# Patient Record
Sex: Male | Born: 1955
Health system: Southern US, Community
[De-identification: ages and names within clinical notes are randomized; demographics above are authoritative.]

## PROBLEM LIST (undated history)

## (undated) DIAGNOSIS — M1990 Unspecified osteoarthritis, unspecified site: Secondary | ICD-10-CM

## (undated) DIAGNOSIS — K611 Rectal abscess: Secondary | ICD-10-CM

## (undated) DIAGNOSIS — Z87442 Personal history of urinary calculi: Secondary | ICD-10-CM

## (undated) DIAGNOSIS — E785 Hyperlipidemia, unspecified: Secondary | ICD-10-CM

## (undated) DIAGNOSIS — K219 Gastro-esophageal reflux disease without esophagitis: Secondary | ICD-10-CM

## (undated) HISTORY — PX: ROTATOR CUFF REPAIR: SHX139

## (undated) HISTORY — PX: UPPER GASTROINTESTINAL ENDOSCOPY: SHX188

## (undated) HISTORY — DX: Hyperlipidemia, unspecified: E78.5

## (undated) HISTORY — DX: Rectal abscess: K61.1

## (undated) HISTORY — PX: INGUINAL HERNIA REPAIR: SHX194

## (undated) HISTORY — PX: POLYPECTOMY: SHX149

## (undated) HISTORY — PX: COLONOSCOPY: SHX174

## (undated) HISTORY — PX: KNEE SURGERY: SHX244

---

## 1999-03-06 ENCOUNTER — Encounter: Payer: Self-pay | Admitting: Neurosurgery

## 1999-03-06 ENCOUNTER — Encounter: Admission: RE | Admit: 1999-03-06 | Discharge: 1999-03-06 | Payer: Self-pay | Admitting: Neurosurgery

## 1999-03-11 ENCOUNTER — Encounter: Payer: Self-pay | Admitting: Neurosurgery

## 1999-03-11 ENCOUNTER — Encounter: Admission: RE | Admit: 1999-03-11 | Discharge: 1999-03-11 | Payer: Self-pay | Admitting: Neurosurgery

## 1999-05-30 ENCOUNTER — Ambulatory Visit (HOSPITAL_BASED_OUTPATIENT_CLINIC_OR_DEPARTMENT_OTHER): Admission: RE | Admit: 1999-05-30 | Discharge: 1999-05-30 | Payer: Self-pay | Admitting: Orthopedic Surgery

## 2000-10-23 ENCOUNTER — Ambulatory Visit (HOSPITAL_COMMUNITY): Admission: RE | Admit: 2000-10-23 | Discharge: 2000-10-23 | Payer: Self-pay | Admitting: Neurosurgery

## 2000-10-23 ENCOUNTER — Encounter: Payer: Self-pay | Admitting: Neurosurgery

## 2003-05-22 ENCOUNTER — Emergency Department (HOSPITAL_COMMUNITY): Admission: EM | Admit: 2003-05-22 | Discharge: 2003-05-22 | Payer: Self-pay | Admitting: Emergency Medicine

## 2007-09-20 ENCOUNTER — Ambulatory Visit: Payer: Self-pay | Admitting: Gastroenterology

## 2007-10-01 ENCOUNTER — Encounter: Payer: Self-pay | Admitting: Gastroenterology

## 2007-10-01 ENCOUNTER — Ambulatory Visit: Payer: Self-pay | Admitting: Gastroenterology

## 2007-10-06 ENCOUNTER — Encounter: Payer: Self-pay | Admitting: Gastroenterology

## 2008-05-03 ENCOUNTER — Encounter (INDEPENDENT_AMBULATORY_CARE_PROVIDER_SITE_OTHER): Payer: Self-pay | Admitting: *Deleted

## 2008-05-05 ENCOUNTER — Ambulatory Visit (HOSPITAL_COMMUNITY): Admission: RE | Admit: 2008-05-05 | Discharge: 2008-05-05 | Payer: Self-pay | Admitting: Surgery

## 2008-08-02 ENCOUNTER — Ambulatory Visit: Payer: Self-pay | Admitting: Gastroenterology

## 2008-08-02 ENCOUNTER — Encounter (INDEPENDENT_AMBULATORY_CARE_PROVIDER_SITE_OTHER): Payer: Self-pay | Admitting: *Deleted

## 2008-08-02 DIAGNOSIS — K219 Gastro-esophageal reflux disease without esophagitis: Secondary | ICD-10-CM | POA: Insufficient documentation

## 2008-08-02 DIAGNOSIS — Z860100 Personal history of colon polyps, unspecified: Secondary | ICD-10-CM | POA: Insufficient documentation

## 2008-08-03 ENCOUNTER — Ambulatory Visit: Payer: Self-pay | Admitting: Gastroenterology

## 2008-08-08 ENCOUNTER — Telehealth: Payer: Self-pay | Admitting: Gastroenterology

## 2008-09-06 ENCOUNTER — Telehealth: Payer: Self-pay | Admitting: Gastroenterology

## 2008-09-25 ENCOUNTER — Emergency Department (HOSPITAL_COMMUNITY): Admission: EM | Admit: 2008-09-25 | Discharge: 2008-09-25 | Payer: Self-pay | Admitting: Emergency Medicine

## 2010-08-03 ENCOUNTER — Emergency Department (HOSPITAL_COMMUNITY): Payer: 59

## 2010-08-03 ENCOUNTER — Emergency Department (HOSPITAL_COMMUNITY)
Admission: EM | Admit: 2010-08-03 | Discharge: 2010-08-03 | Disposition: A | Discharge status: Home / Self Care | Payer: 59 | Attending: Emergency Medicine | Admitting: Emergency Medicine

## 2010-08-03 DIAGNOSIS — W278XXA Contact with other nonpowered hand tool, initial encounter: Secondary | ICD-10-CM | POA: Insufficient documentation

## 2010-08-03 DIAGNOSIS — S61209A Unspecified open wound of unspecified finger without damage to nail, initial encounter: Secondary | ICD-10-CM | POA: Insufficient documentation

## 2010-08-06 NOTE — Op Note (Signed)
NAME:  [name], Albert             ACCOUNT NO.:  [id number]   MEDICAL RECORD NO.:  [id number]          PATIENT TYPE:  AMB   LOCATION:  DAY                          FACILITY:  [facility]   PHYSICIAN:  Ardeth Sportsman, MD     DATE OF BIRTH:  February 24, 1956   DATE OF PROCEDURE:  05/05/2008  DATE OF DISCHARGE:                               OPERATIVE REPORT   PRIMARY CARE PHYSICIAN:  Dr. Murray Hodgkins at [facility].   UROLOGIST:  Dr. Lynelle Smoke I. Tannenbaum at [facility].   SURGEON:  Ardeth Sportsman, MD   ASSISTANT:  None.   PREOPERATIVE DIAGNOSES:  1. Left inguinal hernia.  2. Possible right inguinal hernia.   POSTOPERATIVE DIAGNOSES:  1. Left indirect/direct pantaloon-type hernia.  2. Right direct inguinal hernia.   PROCEDURE:  Laparoscopic bilateral inguinal hernia repair.   ANESTHESIA:  1. General anesthesia.  2. Bilateral ilioinguinal/genitofemoral/cord nerve block.  3. Local anesthetic and a field block around all port sites.   SPECIMENS:  None.   DRAINS:  None.   ESTIMATED BLOOD LOSS:  10 mL.   COMPLICATIONS:  None apparent.   INDICATIONS:  Mr. Diskin is a 55 year old male, rather physically  active, who noted worsening of left groin pain and discomfort.  He was  sent to Dr. Patsi Sears who felt he had a hernia and, therefore, surgical  consultation was requested.  I felt one, as well.  He also had a little  bit of right groin pain and a little laxity in his external ring, and  recommendation was made for bilateral inguinal exploration  laparoscopically through preperitoneal plane.  Anatomy and embryology of  abdominal formation and testicular migration was discussed.  Pathophysiology of inguinal herniation with its natural history and  risks were discussed.  Other options were discussed.  Risks, benefits  and alternatives were discussed.  Questions were answered and he agreed  to proceed.   OPERATIVE FINDINGS:  He had moderate-size indirect  and direct hernias on  the left side with no major cord lipomas.  On the right, he had a more  subtle but definite right direct inguinal hernia.  There was no strong  evidence of an indirect hernia on the right side.  There was no evidence  of any femoral obturator defects.   DESCRIPTION OF PROCEDURE:  Informed consent was confirmed.  The patient  voided just prior to going to the operating room.  He had IV antibiotics  just prior to surgery.  Sequential compression devices were active  during the entire case.  He underwent general anesthesia without any  difficulty.  He was supine with both arms tucked.  His abdomen was  clipped, prepped and draped in a sterile fashion.  A surgical time-out  confirmed our plan.   Entry was gained into the anterior abdominal wall through an  infraumbilical curvilinear incision and a 12-mm port was placed in the  retrorectal/preperitoneal space.  Capnopreperitoneum to 15 mmHg gave  good abdominal insufflation.  Camera dissection was used to free the  peritoneum off bilateral lower quadrants.  Enough working space was  created  such that 5-mm ports were able to be placed in the right  midabdomen and left midabdomen.   Attention was turned towards the left side, since that was the more  obvious side.  The peritoneum was swept off the left flank and also the  anterolateral bladder was swept off its attachments in the anteromedial  true pelvis.  This helped define the inguinal region.  The peritoneum  could be seen coiling into a dilated internal ring consistent with an  indirect inguinal hernia.  The peritoneum was freed off the cord  structures and the hernia sac was reduced and peeled back.  In doing  this, I also saw preperitoneal fat and hernia sac going into a direct  space and this was peeled back as well.  The peritoneum was peeled back  proximally.  In doing the dissection, there was a tear in the hernia sac  and this was closed using a 3-0 Vicryl  stitch intracorporeally to good  result.   Attention was turned towards the right side.  Dissection was carried in  a mirror-image fashion.  There was no strong evidence of an indirect  hernia going up into the internal ring, but he had an obvious direct  defect, although it was not as large.   A 15 x 15 cm Ultra lightweight polypropylene (Ultrapro) mesh was used  for each side.  Each mesh was cut to a half-skull shape.  It was  positioned such that the medial and inferior flap rested in the true  pelvis between the lateral bladder and lateral pelvis.  The mesh laid  well superiorly, medially, laterally and posterior inferiorly, such that  was at least 3 inches of circumferential coverage around the direct and  indirect defects.  The tails of the mesh overlapped in the superomedial  corners in the midline.  The hernia sacs were grasped and elevated  cephalad.  The remaining hernia sacs that undergone both high ligation  surgically were elevated cephalad as the capnopreperitoneum was  evacuated.  The ports were removed.  The fascial defect was closed using  an 0 Vicryl stitch.  The skin was closed  using a 4-0 Monocryl stitch.  A sterile dressing was applied.  The  patient was extubated and sent to the recovery room in stable condition.   I discussed postoperative care with the patient in detail and I am about  to discuss it with his wife and family now.      Ardeth Sportsman, MD  Electronically Signed     SCG/MEDQ  D:  05/05/2008  T:  05/05/2008  Job:  [id number]   cc:   Ace Gins, MD   Sigmund I. Patsi Sears, M.D.  Fax: [phone]

## 2010-08-09 NOTE — Op Note (Signed)
[place]  Patient:    Albert [name], Albert [name]                    MRN: [id number] Proc. Date: 05/30/99 Adm. Date:  [id number] Attending:  Colbert Ewing                           Operative Report  PREOPERATIVE DIAGNOSIS:  Chronic impingement, right shoulder with subacromial bursitis and grade 4 changes acromioclavicular joint.  Partial thickness tear in rotator cuff.  POSTOPERATIVE DIAGNOSIS:  Chronic impingement, right shoulder with subacromial bursitis and grade 4 changes acromioclavicular joint.  Partial thickness tear in rotator cuff with mild to moderate asymptomatic instability global both shoulders. Also attritional tearing anterior labrum.  PROCEDURE:  Examination under anesthesia, right shoulder with arthroscopy, including debridement of labrum and rotator cuff.  Arthroscopic acromioplasty.  Coracoacromial ligament release and distal clavicle excision.  SURGEON:  Loreta Ave, M.D.  ASSISTANT:  Arlys John D. Petrarca, P.A.-C.  ANESTHESIA:  General.  BLOOD LOSS:  Minimal.  SPECIMENS:  None.  CULTURES:  None.  COMPLICATIONS:  None.  DRESSING:  Soft compressive.  PROCEDURE:  Patient brought to the operating room and after adequate anesthesia had been obtained, both shoulders examined.  Both had full motion with some mild to  moderate global instability.  Shoulder really could not be subluxed, but the global instability was noted.  Placed in a beach chair position on a shoulder positioner, prepped and draped in usual sterile fashion.  Three standard arthroscopic portals in the right shoulder.  Shoulder entered with the arthroscope, distended and inspected.  Attritional tearing anterior/superior labrum, which was debrided. Although, there was some laxity in the capsule.  This was asymptomatic clinically and the capsule and ligamentous structures were intact. Rotator cuff from below  looked good.  Nice anchor to the  biceps tendon and the biceps tendon was intact. After the labrum was debrided, the cannula redirected subacromially.  Bursa resected.  Chronic impingement with a type 2 acromion.  Top of the cuff debrided. Musculature of the supraspinatus was noted to be more lateral than usual, but nothing else abnormal.  Acromioplasty converting from a type 2 to a type 1 acromion with the shaver and high-speed bur releasing the CA ligament as well.  Lateral m of clavicle was sharply resected with the shaver and high-speed bur.  Adequacy f decompression and distal clavicle excision confirmed and viewed from all portals. It was adequate.  Instruments and fluid removed.  Portals and shoulder injected  with Marcaine.  The rotator cuff although abraded on the top was intact.  After it had been debrided and I confirmed no full-thickness tears either from above or below.  After the shoulder was injected with Marcaine, portals were closed with  nylon.  Sterile compressive dressing applied with sling.  Anesthesia reversed, brought to recovery room.  Tolerated surgery well, no complications. DD:  05/30/99 TD:  05/31/99 Job: [id number]

## 2011-09-04 ENCOUNTER — Other Ambulatory Visit: Payer: Self-pay | Admitting: Occupational Medicine

## 2011-09-04 ENCOUNTER — Ambulatory Visit: Payer: Self-pay

## 2011-09-04 DIAGNOSIS — M79646 Pain in unspecified finger(s): Secondary | ICD-10-CM

## 2012-09-30 ENCOUNTER — Encounter: Payer: Self-pay | Admitting: Gastroenterology

## 2013-11-29 ENCOUNTER — Encounter: Payer: Self-pay | Admitting: Gastroenterology

## 2014-05-13 ENCOUNTER — Encounter: Payer: Self-pay | Admitting: Gastroenterology

## 2014-10-09 ENCOUNTER — Other Ambulatory Visit (HOSPITAL_COMMUNITY): Payer: Self-pay | Admitting: Sports Medicine

## 2014-10-09 DIAGNOSIS — R102 Pelvic and perineal pain: Secondary | ICD-10-CM

## 2014-10-18 ENCOUNTER — Ambulatory Visit (HOSPITAL_COMMUNITY)
Admission: RE | Admit: 2014-10-18 | Discharge: 2014-10-18 | Disposition: A | Discharge status: Home / Self Care | Payer: 59 | Source: Ambulatory Visit | Attending: Sports Medicine | Admitting: Sports Medicine

## 2014-10-18 DIAGNOSIS — M25552 Pain in left hip: Secondary | ICD-10-CM | POA: Insufficient documentation

## 2014-10-18 DIAGNOSIS — R102 Pelvic and perineal pain: Secondary | ICD-10-CM

## 2014-11-07 ENCOUNTER — Other Ambulatory Visit (HOSPITAL_COMMUNITY): Payer: Self-pay | Admitting: Sports Medicine

## 2014-11-07 DIAGNOSIS — M5104 Intervertebral disc disorders with myelopathy, thoracic region: Secondary | ICD-10-CM

## 2014-11-14 ENCOUNTER — Ambulatory Visit (HOSPITAL_COMMUNITY)
Admission: RE | Admit: 2014-11-14 | Discharge: 2014-11-14 | Disposition: A | Discharge status: Home / Self Care | Payer: 59 | Source: Ambulatory Visit | Attending: Sports Medicine | Admitting: Sports Medicine

## 2014-11-14 DIAGNOSIS — M47896 Other spondylosis, lumbar region: Secondary | ICD-10-CM | POA: Insufficient documentation

## 2014-11-14 DIAGNOSIS — M5104 Intervertebral disc disorders with myelopathy, thoracic region: Secondary | ICD-10-CM

## 2014-11-14 DIAGNOSIS — M5126 Other intervertebral disc displacement, lumbar region: Secondary | ICD-10-CM | POA: Diagnosis present

## 2015-02-02 ENCOUNTER — Other Ambulatory Visit: Payer: Self-pay | Admitting: Surgery

## 2015-03-12 ENCOUNTER — Encounter (HOSPITAL_BASED_OUTPATIENT_CLINIC_OR_DEPARTMENT_OTHER): Payer: Self-pay | Admitting: *Deleted

## 2015-03-20 NOTE — H&P (Signed)
Albert [name]  Location: [facility] Patient #: [id number] DOB: [birth year] Married / Language: English / Race: White Male   History of Present Illness The patient is a 59 year old male who presents with an inguinal hernia. 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 He is referred by Dr. Juanita Craver. He presents with a recurrent left inguinal hernia. He had a previous laparoscopic bilateral inguinal hernia repair with mesh back in 2010 by Dr. Johney Maine. He was recently is noticed a painful bulge which easily reduces in the left inguinal area. He feels like it is creating some constipation. The pain is mild to moderate and intermittent. He has had no problems on the right side.   Other Problems Back Pain Gastroesophageal Reflux Disease Inguinal Hernia Kidney Stone  Past Surgical History Knee Surgery Right. Laparoscopic Inguinal Hernia Surgery Left.  Diagnostic Studies History Colonoscopy 5-10 years ago  Allergies No Known Drug Allergies11/01/2015  Medication History Advil (100MG  Tablet Chewable, Oral) Active. Medications Reconciled  Social History Alcohol use Remotely quit alcohol use. Caffeine use Carbonated beverages, Tea. No drug use Tobacco use Current some day smoker.  Family History Cancer Brother. Diabetes Mellitus Mother. Heart Disease Father, Mother. Heart disease in male family member before age 91 Heart disease in male family member before age 44    Review of Systems  General Not Present- Appetite Loss, Chills, Fatigue, Fever, Night Sweats, Weight Gain and Weight Loss. Skin Not Present- Change in Wart/Mole, Dryness, Hives, Jaundice, New Lesions, Non-Healing Wounds, Rash and Ulcer. HEENT Present- Earache, Hearing Loss and Ringing in the Ears. Not Present- Hoarseness, Nose Bleed, Oral Ulcers, Seasonal Allergies, Sinus Pain, Sore Throat, Visual Disturbances, Wears glasses/contact lenses and Yellow  Eyes. Respiratory Present- Snoring. Not Present- Bloody sputum, Chronic Cough, Difficulty Breathing and Wheezing. Breast Not Present- Breast Mass, Breast Pain, Nipple Discharge and Skin Changes. Cardiovascular Not Present- Chest Pain, Difficulty Breathing Lying Down, Leg Cramps, Palpitations, Rapid Heart Rate, Shortness of Breath and Swelling of Extremities. Gastrointestinal Present- Change in Bowel Habits, Constipation and Indigestion. Not Present- Abdominal Pain, Bloating, Bloody Stool, Chronic diarrhea, Difficulty Swallowing, Excessive gas, Gets full quickly at meals, Hemorrhoids, Nausea, Rectal Pain and Vomiting. Male Genitourinary Present- Nocturia. Not Present- Blood in Urine, Change in Urinary Stream, Frequency, Impotence, Painful Urination, Urgency and Urine Leakage. Musculoskeletal Present- Back Pain and Joint Pain. Not Present- Joint Stiffness, Muscle Pain, Muscle Weakness and Swelling of Extremities. Neurological Not Present- Decreased Memory, Fainting, Headaches, Numbness, Seizures, Tingling, Tremor, Trouble walking and Weakness. Psychiatric Not Present- Anxiety, Bipolar, Change in Sleep Pattern, Depression, Fearful and Frequent crying. Endocrine Not Present- Cold Intolerance, Excessive Hunger, Hair Changes, Heat Intolerance, Hot flashes and New Diabetes. Hematology Not Present- Easy Bruising, Excessive bleeding, Gland problems, HIV and Persistent Infections.  Vitals  02/02/2015 8:50 AM Weight: 158.2 lb Height: 70in Body Surface Area: 1.89 m Body Mass Index: 22.7 kg/m  Temp.: 97.22F(Temporal)  Pulse: 70 (Regular)  BP: 130/70 (Sitting, Left Arm, Standard)   Physical Exam  The physical exam findings are as follows: Note:He is well appearance Lungs are clear bilaterally Cardiovascular is regular rate and rhythm He has a very slight small reducible recurrent left inguinal hernia. There is no evidence of umbilical hernia or right inguinal hernia    Assessment &  Plan   RECURRENT LEFT INGUINAL HERNIA (K40.91)  Impression: I discussed the diagnosis with him in detail. I would need to fix this open as I would not be able to get into the  preperitoneal space given his previous surgery. I discussed the risk of surgery which includes but is not limited to bleeding, infection, recurrence, use of mesh, etc. I also discussed possibility of chronic pain and postoperative recovery. He understands and wishes to proceed with surgery

## 2015-03-21 ENCOUNTER — Encounter (HOSPITAL_BASED_OUTPATIENT_CLINIC_OR_DEPARTMENT_OTHER): Payer: Self-pay | Admitting: *Deleted

## 2015-03-21 ENCOUNTER — Ambulatory Visit (HOSPITAL_BASED_OUTPATIENT_CLINIC_OR_DEPARTMENT_OTHER): Payer: 59 | Admitting: Anesthesiology

## 2015-03-21 ENCOUNTER — Ambulatory Visit (HOSPITAL_BASED_OUTPATIENT_CLINIC_OR_DEPARTMENT_OTHER)
Admission: RE | Admit: 2015-03-21 | Discharge: 2015-03-21 | Disposition: A | Discharge status: Home / Self Care | Payer: 59 | Source: Ambulatory Visit | Attending: Surgery | Admitting: Surgery

## 2015-03-21 ENCOUNTER — Encounter (HOSPITAL_BASED_OUTPATIENT_CLINIC_OR_DEPARTMENT_OTHER)
Admission: RE | Disposition: A | Discharge status: Home / Self Care | Payer: Self-pay | Source: Ambulatory Visit | Attending: Surgery

## 2015-03-21 DIAGNOSIS — F17200 Nicotine dependence, unspecified, uncomplicated: Secondary | ICD-10-CM | POA: Diagnosis not present

## 2015-03-21 DIAGNOSIS — K219 Gastro-esophageal reflux disease without esophagitis: Secondary | ICD-10-CM | POA: Diagnosis not present

## 2015-03-21 DIAGNOSIS — K4091 Unilateral inguinal hernia, without obstruction or gangrene, recurrent: Secondary | ICD-10-CM | POA: Insufficient documentation

## 2015-03-21 HISTORY — PX: INGUINAL HERNIA REPAIR: SHX194

## 2015-03-21 HISTORY — PX: INSERTION OF MESH: SHX5868

## 2015-03-21 HISTORY — DX: Gastro-esophageal reflux disease without esophagitis: K21.9

## 2015-03-21 SURGERY — REPAIR, HERNIA, INGUINAL, ADULT
Anesthesia: General | Site: Groin | Laterality: Left

## 2015-03-21 MED ORDER — DEXAMETHASONE SODIUM PHOSPHATE 10 MG/ML IJ SOLN
INTRAMUSCULAR | Status: AC
  Filled 2015-03-21: qty 1

## 2015-03-21 MED ORDER — BUPIVACAINE-EPINEPHRINE 0.5% -1:200000 IJ SOLN
INTRAMUSCULAR | Status: DC | PRN
  Administered 2015-03-21: 20 mL

## 2015-03-21 MED ORDER — SUGAMMADEX SODIUM 200 MG/2ML IV SOLN
INTRAVENOUS | Status: AC
  Filled 2015-03-21: qty 2

## 2015-03-21 MED ORDER — CEFAZOLIN SODIUM-DEXTROSE 2-3 GM-% IV SOLR
INTRAVENOUS | Status: AC
  Filled 2015-03-21: qty 50

## 2015-03-21 MED ORDER — OXYCODONE HCL 5 MG/5ML PO SOLN: 5.0000 mg | Freq: Once | ORAL | Status: DC | PRN

## 2015-03-21 MED ORDER — HYDROMORPHONE HCL 1 MG/ML IJ SOLN
0.2500 mg | INTRAMUSCULAR | Status: DC | PRN
  Administered 2015-03-21: 0.25 mg via INTRAVENOUS

## 2015-03-21 MED ORDER — HYDROMORPHONE HCL 1 MG/ML IJ SOLN
INTRAMUSCULAR | Status: AC
  Filled 2015-03-21: qty 1

## 2015-03-21 MED ORDER — OXYCODONE HCL 5 MG PO TABS: 5.0000 mg | ORAL_TABLET | Freq: Once | ORAL | Status: DC | PRN

## 2015-03-21 MED ORDER — SUCCINYLCHOLINE CHLORIDE 20 MG/ML IJ SOLN
INTRAMUSCULAR | Status: AC
  Filled 2015-03-21: qty 1

## 2015-03-21 MED ORDER — MIDAZOLAM HCL 2 MG/2ML IJ SOLN
INTRAMUSCULAR | Status: AC
  Filled 2015-03-21: qty 2

## 2015-03-21 MED ORDER — SCOPOLAMINE 1 MG/3DAYS TD PT72: 1.0000 | MEDICATED_PATCH | Freq: Once | TRANSDERMAL | Status: DC | PRN

## 2015-03-21 MED ORDER — MIDAZOLAM HCL 2 MG/2ML IJ SOLN
1.0000 mg | INTRAMUSCULAR | Status: DC | PRN
  Administered 2015-03-21: 2 mg via INTRAVENOUS

## 2015-03-21 MED ORDER — BUPIVACAINE-EPINEPHRINE (PF) 0.5% -1:200000 IJ SOLN
INTRAMUSCULAR | Status: AC
  Filled 2015-03-21: qty 30

## 2015-03-21 MED ORDER — LIDOCAINE HCL (PF) 1 % IJ SOLN
INTRAMUSCULAR | Status: AC
  Filled 2015-03-21: qty 30

## 2015-03-21 MED ORDER — LIDOCAINE HCL (CARDIAC) 20 MG/ML IV SOLN
INTRAVENOUS | Status: AC
  Filled 2015-03-21: qty 5

## 2015-03-21 MED ORDER — MEPERIDINE HCL 25 MG/ML IJ SOLN: 6.2500 mg | INTRAMUSCULAR | Status: DC | PRN

## 2015-03-21 MED ORDER — SUCCINYLCHOLINE CHLORIDE 20 MG/ML IJ SOLN
INTRAMUSCULAR | Status: DC | PRN
  Administered 2015-03-21: 50 mg via INTRAVENOUS

## 2015-03-21 MED ORDER — GLYCOPYRROLATE 0.2 MG/ML IJ SOLN: 0.2000 mg | Freq: Once | INTRAMUSCULAR | Status: DC | PRN

## 2015-03-21 MED ORDER — LACTATED RINGERS IV SOLN
INTRAVENOUS | Status: DC
  Administered 2015-03-21 (×2): via INTRAVENOUS

## 2015-03-21 MED ORDER — PROPOFOL 10 MG/ML IV BOLUS
INTRAVENOUS | Status: AC
  Filled 2015-03-21: qty 20

## 2015-03-21 MED ORDER — ONDANSETRON HCL 4 MG/2ML IJ SOLN
INTRAMUSCULAR | Status: AC
  Filled 2015-03-21: qty 2

## 2015-03-21 MED ORDER — FENTANYL CITRATE (PF) 100 MCG/2ML IJ SOLN
INTRAMUSCULAR | Status: AC
  Filled 2015-03-21: qty 2

## 2015-03-21 MED ORDER — SODIUM BICARBONATE 4 % IV SOLN
INTRAVENOUS | Status: AC
  Filled 2015-03-21: qty 5

## 2015-03-21 MED ORDER — FENTANYL CITRATE (PF) 100 MCG/2ML IJ SOLN
INTRAMUSCULAR | Status: DC | PRN
  Administered 2015-03-21: 100 ug via INTRAVENOUS

## 2015-03-21 MED ORDER — ONDANSETRON HCL 4 MG/2ML IJ SOLN
INTRAMUSCULAR | Status: DC | PRN
Start: 2015-03-21 — End: 2015-03-21
  Administered 2015-03-21: 4 mg via INTRAVENOUS

## 2015-03-21 MED ORDER — CEFAZOLIN SODIUM-DEXTROSE 2-3 GM-% IV SOLR
2.0000 g | INTRAVENOUS | Status: AC
  Administered 2015-03-21: 2 g via INTRAVENOUS

## 2015-03-21 MED ORDER — FENTANYL CITRATE (PF) 100 MCG/2ML IJ SOLN
50.0000 ug | INTRAMUSCULAR | Status: DC | PRN
  Administered 2015-03-21: 100 ug via INTRAVENOUS

## 2015-03-21 MED ORDER — BUPIVACAINE HCL (PF) 0.5 % IJ SOLN
INTRAMUSCULAR | Status: AC
  Filled 2015-03-21: qty 30

## 2015-03-21 MED ORDER — MIDAZOLAM HCL 5 MG/5ML IJ SOLN
INTRAMUSCULAR | Status: DC | PRN
  Administered 2015-03-21: 2 mg via INTRAVENOUS

## 2015-03-21 MED ORDER — OXYCODONE-ACETAMINOPHEN 5-325 MG PO TABS: 1.0000 | ORAL_TABLET | ORAL | Status: DC | PRN

## 2015-03-21 MED ORDER — DEXAMETHASONE SODIUM PHOSPHATE 4 MG/ML IJ SOLN
INTRAMUSCULAR | Status: DC | PRN
  Administered 2015-03-21: 10 mg via INTRAVENOUS

## 2015-03-21 MED ORDER — LIDOCAINE HCL (CARDIAC) 20 MG/ML IV SOLN
INTRAVENOUS | Status: DC | PRN
  Administered 2015-03-21: 50 mg via INTRAVENOUS

## 2015-03-21 SURGICAL SUPPLY — 48 items
BLADE CLIPPER SURG (BLADE) ×3 IMPLANT
BLADE HEX COATED 2.75 (ELECTRODE) ×3 IMPLANT
BLADE SURG 10 STRL SS (BLADE) ×3 IMPLANT
CANISTER SUCTION 1200CC (MISCELLANEOUS) IMPLANT
CHLORAPREP W/TINT 26ML (MISCELLANEOUS) ×3 IMPLANT
COVER BACK TABLE 60X90IN (DRAPES) ×3 IMPLANT
COVER MAYO STAND STRL (DRAPES) ×3 IMPLANT
DECANTER SPIKE VIAL GLASS SM (MISCELLANEOUS) ×3 IMPLANT
DRAIN PENROSE 1/2X12 LTX STRL (WOUND CARE) ×3 IMPLANT
DRAPE LAPAROTOMY 100X72 PEDS (DRAPES) ×3 IMPLANT
DRAPE UTILITY XL STRL (DRAPES) ×3 IMPLANT
ELECT REM PT RETURN 9FT ADLT (ELECTROSURGICAL) ×3
ELECTRODE REM PT RTRN 9FT ADLT (ELECTROSURGICAL) ×1 IMPLANT
GLOVE BIOGEL PI IND STRL 6.5 (GLOVE) ×1 IMPLANT
GLOVE BIOGEL PI IND STRL 7.0 (GLOVE) ×1 IMPLANT
GLOVE BIOGEL PI INDICATOR 6.5 (GLOVE) ×2
GLOVE BIOGEL PI INDICATOR 7.0 (GLOVE) ×2
GLOVE EXAM NITRILE MD LF STRL (GLOVE) ×3 IMPLANT
GLOVE SURG SIGNA 7.5 PF LTX (GLOVE) ×3 IMPLANT
GOWN STRL REUS W/ TWL LRG LVL3 (GOWN DISPOSABLE) ×1 IMPLANT
GOWN STRL REUS W/ TWL XL LVL3 (GOWN DISPOSABLE) ×1 IMPLANT
GOWN STRL REUS W/TWL LRG LVL3 (GOWN DISPOSABLE) ×2
GOWN STRL REUS W/TWL XL LVL3 (GOWN DISPOSABLE) ×2
LIQUID BAND (GAUZE/BANDAGES/DRESSINGS) ×3 IMPLANT
MESH PARIETEX PROGRIP LEFT (Mesh General) ×3 IMPLANT
NEEDLE HYPO 22GX1.5 SAFETY (NEEDLE) ×3 IMPLANT
NEEDLE HYPO 25X1 1.5 SAFETY (NEEDLE) ×3 IMPLANT
NS IRRIG 1000ML POUR BTL (IV SOLUTION) ×3 IMPLANT
PACK BASIN DAY SURGERY FS (CUSTOM PROCEDURE TRAY) ×3 IMPLANT
PENCIL BUTTON HOLSTER BLD 10FT (ELECTRODE) ×3 IMPLANT
SLEEVE SCD COMPRESS KNEE MED (MISCELLANEOUS) ×3 IMPLANT
SPONGE INTESTINAL PEANUT (DISPOSABLE) IMPLANT
SPONGE LAP 4X18 X RAY DECT (DISPOSABLE) ×3 IMPLANT
SUT MNCRL AB 4-0 PS2 18 (SUTURE) ×3 IMPLANT
SUT SILK 2 0 SH (SUTURE) IMPLANT
SUT SURG 0 T 19/GS 22 1969 62 (SUTURE) IMPLANT
SUT VIC AB 2-0 CT1 27 (SUTURE) ×2
SUT VIC AB 2-0 CT1 TAPERPNT 27 (SUTURE) ×1 IMPLANT
SUT VIC AB 3-0 CT1 27 (SUTURE) ×2
SUT VIC AB 3-0 CT1 27XBRD (SUTURE) ×1 IMPLANT
SUT VICRYL AB 3 0 TIES (SUTURE) ×3 IMPLANT
SYR BULB 3OZ (MISCELLANEOUS) IMPLANT
SYR CONTROL 10ML LL (SYRINGE) ×3 IMPLANT
TOWEL OR 17X24 6PK STRL BLUE (TOWEL DISPOSABLE) ×6 IMPLANT
TOWEL OR NON WOVEN STRL DISP B (DISPOSABLE) ×3 IMPLANT
TUBE CONNECTING 20'X1/4 (TUBING)
TUBE CONNECTING 20X1/4 (TUBING) IMPLANT
YANKAUER SUCT BULB TIP NO VENT (SUCTIONS) IMPLANT

## 2015-03-21 NOTE — Progress Notes (Signed)
Assisted Dr. Al Corpus with left, ultrasound guided, transabdominal plane block. Side rails up, monitors on throughout procedure. See vital signs in flow sheet. Tolerated Procedure well. Pre procedure complete

## 2015-03-21 NOTE — Op Note (Signed)
NAMENUNZIO, OLAFSON             ACCOUNT NO.:  [id number]  MEDICAL RECORD NO.:  [id number]  LOCATION:                               FACILITY:  [facility]  PHYSICIAN:  Coralie Keens, M.D. DATE OF BIRTH:  1955/08/03  DATE OF PROCEDURE:  03/21/2015 DATE OF DISCHARGE:  03/21/2015                              OPERATIVE REPORT   PREOPERATIVE DIAGNOSIS:  Recurrent left inguinal hernia.  POSTOPERATIVE DIAGNOSIS:  Recurrent left inguinal hernia.  PROCEDURES:  Repair of recurrent left inguinal hernia with mesh.  SURGEON:  Coralie Keens, M.D.  ANESTHESIA:  General with TAP block provided by Anesthesia and Marcaine.  ESTIMATED BLOOD LOSS:  Minimal.  FINDINGS:  The patient was found to have a direct left inguinal hernia.  INDICATIONS:  This is a 59 year old gentleman, who has had a previous laparoscopic left inguinal hernia repair with mesh in 2010.  He now presents with a recurrent hernia.  Decision was made to proceed to the operating room for repair.  FINDINGS:  The patient was found to have a direct hernia without evidence of indirect hernia.  It was repaired with a piece of Proceed ProGrip Prolene mesh.  PROCEDURE IN DETAIL:  The patient was brought to the operating room, identified as Reola Calkins.  He was placed supine on the operating room table, and general anesthesia was induced.  His abdomen was then prepped and draped in usual sterile fashion.  I anesthetized skin in the left lower quadrant with Marcaine, I then made a longitudinal incision with a scalpel.  I took this down through Scarpa's fascia with electrocautery.  The external oblique fascia was identified and opened toward the internal and external rings.  The testicular cord and structures were then easily controlled.  The patient had no evidence of indirect hernia.  There was a direct hernia sac which I reduced and then imbricated the tissue over the top with a 2-0 Vicryl suture.  I then brought a piece of  Proceed ProGrip Prolene mesh onto the field.  I placed this as an onlay against the pubic tubercle, brought around the cord structures widely covering the inguinal floor.  I then sewed it in place with 2-0 Vicryl sutures.  Again, wide coverage of the inguinal floor appeared to be achieved.  At this point, I anesthetized the fascia further with Marcaine.  I closed the external oblique fascia with a running 2-0 Vicryl suture.  I then closed Scarpa's fascia with interrupted 3-0 Vicryl sutures, and closed the skin with a running 4-0 Monocryl.  Skin glue was then applied.  The patient tolerated the procedure well.  All counts were correct at the end of procedure.  The patient was then extubated in the operating room and taken in a stable condition to the recovery room.     Coralie Keens, M.D.     DB/MEDQ  D:  03/21/2015  T:  03/21/2015  Job:  [id number]

## 2015-03-21 NOTE — Anesthesia Preprocedure Evaluation (Signed)
Anesthesia Evaluation  Patient identified by MRN, date of birth, ID band Patient awake    Reviewed: Allergy & Precautions, NPO status , Patient's Chart, lab work & pertinent test results  Airway Mallampati: I  TM Distance: >3 FB Neck ROM: Full    Dental  (+) Teeth Intact, Dental Advisory Given,    Pulmonary Current Smoker,    breath sounds clear to auscultation       Cardiovascular  Rhythm:Regular Rate:Normal     Neuro/Psych    GI/Hepatic GERD  Medicated and Controlled,  Endo/Other    Renal/GU      Musculoskeletal   Abdominal   Peds  Hematology   Anesthesia Other Findings   Reproductive/Obstetrics                             Anesthesia Physical Anesthesia Plan  ASA: II  Anesthesia Plan: General   Post-op Pain Management: MAC Combined w/ Regional for Post-op pain   Induction: Intravenous  Airway Management Planned: LMA  Additional Equipment:   Intra-op Plan:   Post-operative Plan: Extubation in OR  Informed Consent: I have reviewed the patients History and Physical, chart, labs and discussed the procedure including the risks, benefits and alternatives for the proposed anesthesia with the patient or authorized representative who has indicated his/her understanding and acceptance.   Dental advisory given  Plan Discussed with: CRNA, Anesthesiologist and Surgeon  Anesthesia Plan Comments:         Anesthesia Quick Evaluation

## 2015-03-21 NOTE — Op Note (Signed)
OPEN LEFT INGUINAL HERNIA REPAIR WITH MESH, INSERTION OF MESH  Procedure Note  JUNE KOESTLER 03/21/2015   Pre-op Diagnosis: RECURRENT LEFT INGUINAL HERNIA     Post-op Diagnosis: same  Procedure(s): OPEN LEFT INGUINAL HERNIA REPAIR WITH MESH INSERTION OF MESH  Surgeon(s): Coralie Keens, MD  Anesthesia: General  Staff:  Circulator: Maurene Capes, RN Scrub Person: Rhea Pink Neiers, CST  Estimated Blood Loss: Minimal                         Dorotha Hirschi A   Date: 03/21/2015  Time: 8:54 AM

## 2015-03-21 NOTE — Discharge Instructions (Signed)
[facility]  UMBILICAL OR INGUINAL HERNIA REPAIR: POST OP INSTRUCTIONS  Always review your discharge instruction sheet given to you by the facility where your surgery was performed. IF YOU HAVE DISABILITY OR FAMILY LEAVE FORMS, YOU MUST BRING THEM TO THE OFFICE FOR PROCESSING.   DO NOT GIVE THEM TO YOUR DOCTOR.  1. A  prescription for pain medication may be given to you upon discharge.  Take your pain medication as prescribed, if needed.  If narcotic pain medicine is not needed, then you may take acetaminophen (Tylenol) or ibuprofen (Advil) as needed. 2. Take your usually prescribed medications unless otherwise directed. 3. If you need a refill on your pain medication, please contact your pharmacy.  They will contact our office to request authorization. Prescriptions will not be filled after 5 pm or on week-ends. 4. You should follow a light diet the first 24 hours after arrival home, such as soup and crackers, etc.  Be sure to include lots of fluids daily.  Resume your normal diet the day after surgery. 5. Most patients will experience some swelling and bruising around the umbilicus or in the groin and scrotum.  Ice packs and reclining will help.  Swelling and bruising can take several days to resolve.  6. It is common to experience some constipation if taking pain medication after surgery.  Increasing fluid intake and taking a stool softener (such as Colace) will usually help or prevent this problem from occurring.  A mild laxative (Milk of Magnesia or Miralax) should be taken according to package directions if there are no bowel movements after 48 hours. 7. Unless discharge instructions indicate otherwise, you may remove your bandages 24-48 hours after surgery, and you may shower at that time.  You may have steri-strips (small skin tapes) in place directly over the incision.  These strips should be left on the skin for 7-10 days.  If your surgeon used skin glue on the  incision, you may shower in 24 hours.  The glue will flake off over the next 2-3 weeks.  Any sutures or staples will be removed at the office during your follow-up visit. 8. ACTIVITIES:  You may resume regular (light) daily activities beginning the next day--such as daily self-care, walking, climbing stairs--gradually increasing activities as tolerated.  You may have sexual intercourse when it is comfortable.  Refrain from any heavy lifting or straining until approved by your doctor. a. You may drive when you are no longer taking prescription pain medication, you can comfortably wear a seatbelt, and you can safely maneuver your car and apply brakes. b. RETURN TO WORK:  __________________________________________________________ 9. You should see your doctor in the office for a follow-up appointment approximately 2-3 weeks after your surgery.  Make sure that you call for this appointment within a day or two after you arrive home to insure a convenient appointment time. 10. OTHER INSTRUCTIONS: NO LIFTING MORE THAN 15 TO 20 POUNDS FOR 4 WEEKS 11. ICE PACK AND IBUPROFEN ALSO FOR PAIN __________________________________________________________________________________________________________________________________________________________________________________________  WHEN TO CALL YOUR DOCTOR: 1. Fever over 101.0 2. Inability to urinate 3. Nausea and/or vomiting 4. Extreme swelling or bruising 5. Continued bleeding from incision. 6. Increased pain, redness, or drainage from the incision  The clinic staff is available to answer your questions during regular business hours.  Please dont hesitate to call and ask to speak to one of the nurses for clinical concerns.  If you have a medical emergency, go to the nearest emergency room or  call 911.  A surgeon from [facility] is always on call at the hospital   [address], [place], [place], [place]  [address] ?  [address],  [place], [place]   [phone] ? [phone] ? FAX [phone] Web site: [email or web address]   Post Anesthesia Home Care Instructions  Activity: Get plenty of rest for the remainder of the day. A responsible adult should stay with you for 24 hours following the procedure.  For the next 24 hours, DO NOT: -Drive a car -Paediatric nurse -Drink alcoholic beverages -Take any medication unless instructed by your physician -Make any legal decisions or sign important papers.  Meals: Start with liquid foods such as gelatin or soup. Progress to regular foods as tolerated. Avoid greasy, spicy, heavy foods. If nausea and/or vomiting occur, drink only clear liquids until the nausea and/or vomiting subsides. Call your physician if vomiting continues.  Special Instructions/Symptoms: Your throat may feel dry or sore from the anesthesia or the breathing tube placed in your throat during surgery. If this causes discomfort, gargle with warm salt water. The discomfort should disappear within 24 hours.  If you had a scopolamine patch placed behind your ear for the management of post- operative nausea and/or vomiting:  1. The medication in the patch is effective for 72 hours, after which it should be removed.  Wrap patch in a tissue and discard in the trash. Wash hands thoroughly with soap and water. 2. You may remove the patch earlier than 72 hours if you experience unpleasant side effects which may include dry mouth, dizziness or visual disturbances. 3. Avoid touching the patch. Wash your hands with soap and water after contact with the patch.

## 2015-03-21 NOTE — Anesthesia Postprocedure Evaluation (Signed)
Anesthesia Post Note  Patient: Albert [name]  Procedure(s) Performed: Procedure(s) (LRB): OPEN LEFT INGUINAL HERNIA REPAIR WITH MESH (Left) INSERTION OF MESH (Left)  Patient location during evaluation: PACU Anesthesia Type: General Level of consciousness: awake and alert Pain management: pain level controlled Vital Signs Assessment: post-procedure vital signs reviewed and stable Respiratory status: spontaneous breathing, nonlabored ventilation and respiratory function stable Cardiovascular status: blood pressure returned to baseline and stable Postop Assessment: no signs of nausea or vomiting Anesthetic complications: no    Last Vitals:  Filed Vitals:   03/21/15 0930 03/21/15 0945  BP: 117/71 103/82  Pulse: 72 61  Temp:    Resp: 18 14    Last Pain:  Filed Vitals:   03/21/15 0948  PainSc: 5                  Hadlyn Amero,Reece A

## 2015-03-21 NOTE — Transfer of Care (Signed)
Immediate Anesthesia Transfer of Care Note  Patient: Albert [name]  Procedure(s) Performed: Procedure(s): OPEN LEFT INGUINAL HERNIA REPAIR WITH MESH (Left) INSERTION OF MESH (Left)  Patient Location: PACU  Anesthesia Type:GA combined with regional for post-op pain  Level of Consciousness: sedated  Airway & Oxygen Therapy: Patient Spontanous Breathing and Patient connected to face mask oxygen  Post-op Assessment: Report given to RN and Post -op Vital signs reviewed and stable  Post vital signs: Reviewed and stable  Last Vitals:  Filed Vitals:   03/21/15 0745 03/21/15 0800  BP: 105/76 135/70  Pulse: 62 63  Temp:    Resp: 17 16    Complications: No apparent anesthesia complications

## 2015-03-21 NOTE — Interval H&P Note (Signed)
History and Physical Interval Note: no change in H and P  03/21/2015 7:34 AM  Albert [name]  has presented today for surgery, with the diagnosis of RECURRENT LEFT INGUINAL HERNIA  The various methods of treatment have been discussed with the patient and family. After consideration of risks, benefits and other options for treatment, the patient has consented to  Procedure(s): OPEN LEFT INGUINAL HERNIA REPAIR WITH MESH (Left) INSERTION OF MESH (Left) as a surgical intervention .  The patient's history has been reviewed, patient examined, no change in status, stable for surgery.  I have reviewed the patient's chart and labs.  Questions were answered to the patient's satisfaction.     Dayona Shaheen A

## 2015-03-21 NOTE — Anesthesia Procedure Notes (Addendum)
Anesthesia Regional Block:  TAP block  Pre-Anesthetic Checklist: ,, timeout performed, Correct Patient, Correct Site, Correct Laterality, Correct Procedure, Correct Position, site marked, Risks and benefits discussed,  Surgical consent,  Pre-op evaluation,  At surgeon's request and post-op pain management  Laterality: Left and Upper  Prep: chloraprep       Needles:  Injection technique: Single-shot  Needle Type: Echogenic Needle     Needle Length: 9cm 9 cm Needle Gauge: 21 and 21 G    Additional Needles:  Procedures: ultrasound guided (picture in chart) TAP block Narrative:  Start time: 03/21/2015 7:56 AM End time: 03/21/2015 8:00 AM Injection made incrementally with aspirations every 5 mL.  Performed by: Personally  Anesthesiologist: CREWS, Christino   Procedure Name: LMA Insertion Date/Time: 03/21/2015 8:21 AM Performed by: Marrianne Mood Pre-anesthesia Checklist: Patient identified, Emergency Drugs available, Suction available, Patient being monitored and Timeout performed Patient Re-evaluated:Patient Re-evaluated prior to inductionOxygen Delivery Method: Circle System Utilized Preoxygenation: Pre-oxygenation with 100% oxygen Intubation Type: IV induction Ventilation: Mask ventilation without difficulty LMA: LMA inserted LMA Size: 5.0 Number of attempts: 1 Airway Equipment and Method: Bite block Placement Confirmation: positive ETCO2 Tube secured with: Tape Dental Injury: Teeth and Oropharynx as per pre-operative assessment       Left TAP block image

## 2015-03-22 ENCOUNTER — Encounter (HOSPITAL_BASED_OUTPATIENT_CLINIC_OR_DEPARTMENT_OTHER): Payer: Self-pay | Admitting: Surgery

## 2015-05-15 DIAGNOSIS — K612 Anorectal abscess: Secondary | ICD-10-CM | POA: Diagnosis not present

## 2015-05-16 DIAGNOSIS — K612 Anorectal abscess: Secondary | ICD-10-CM | POA: Diagnosis not present

## 2015-05-16 DIAGNOSIS — K219 Gastro-esophageal reflux disease without esophagitis: Secondary | ICD-10-CM | POA: Diagnosis not present

## 2015-05-28 MED FILL — OMEPRAZOLE DR 40 MG CAPSULE: 40 | 30 days supply | Qty: 30 | Fill #0

## 2015-07-25 DIAGNOSIS — G8929 Other chronic pain: Secondary | ICD-10-CM | POA: Diagnosis not present

## 2015-07-25 DIAGNOSIS — R1032 Left lower quadrant pain: Secondary | ICD-10-CM | POA: Diagnosis not present

## 2015-07-25 MED FILL — LYRICA 75 MG CAPSULE: 75 | 30 days supply | Qty: 60 | Fill #0

## 2015-08-13 DIAGNOSIS — M545 Low back pain: Secondary | ICD-10-CM | POA: Diagnosis not present

## 2015-08-13 DIAGNOSIS — M5136 Other intervertebral disc degeneration, lumbar region: Secondary | ICD-10-CM | POA: Diagnosis not present

## 2015-08-13 DIAGNOSIS — M7062 Trochanteric bursitis, left hip: Secondary | ICD-10-CM | POA: Diagnosis not present

## 2015-08-30 DIAGNOSIS — M5136 Other intervertebral disc degeneration, lumbar region: Secondary | ICD-10-CM | POA: Diagnosis not present

## 2015-08-30 MED FILL — MELOXICAM 7.5 MG TABLET: 7.5 | 15 days supply | Qty: 30 | Fill #0

## 2015-09-17 DIAGNOSIS — M25552 Pain in left hip: Secondary | ICD-10-CM | POA: Diagnosis not present

## 2015-09-17 MED FILL — OMEPRAZOLE DR 40 MG CAPSULE: 40 | 30 days supply | Qty: 30 | Fill #1

## 2015-09-27 DIAGNOSIS — M25552 Pain in left hip: Secondary | ICD-10-CM | POA: Diagnosis not present

## 2015-10-03 DIAGNOSIS — G8929 Other chronic pain: Secondary | ICD-10-CM | POA: Diagnosis not present

## 2015-10-03 DIAGNOSIS — R1032 Left lower quadrant pain: Secondary | ICD-10-CM | POA: Diagnosis not present

## 2015-10-29 DIAGNOSIS — M545 Low back pain: Secondary | ICD-10-CM | POA: Diagnosis not present

## 2015-10-29 DIAGNOSIS — M25552 Pain in left hip: Secondary | ICD-10-CM | POA: Diagnosis not present

## 2015-10-29 DIAGNOSIS — M5136 Other intervertebral disc degeneration, lumbar region: Secondary | ICD-10-CM | POA: Diagnosis not present

## 2015-11-07 MED FILL — MELOXICAM 7.5 MG TABLET: 7.5 | 15 days supply | Qty: 30 | Fill #1

## 2015-11-23 DIAGNOSIS — M545 Low back pain: Secondary | ICD-10-CM | POA: Diagnosis not present

## 2015-11-23 DIAGNOSIS — M5136 Other intervertebral disc degeneration, lumbar region: Secondary | ICD-10-CM | POA: Diagnosis not present

## 2015-11-27 MED FILL — ESOMEPRAZOLE MAG DR 40 MG C: 40 | 30 days supply | Qty: 30 | Fill #0

## 2015-12-11 DIAGNOSIS — M7062 Trochanteric bursitis, left hip: Secondary | ICD-10-CM | POA: Diagnosis not present

## 2015-12-11 MED FILL — DICLOFENAC SODIUM 1% GEL: 1 | 25 days supply | Qty: 100 | Fill #0

## 2016-01-09 ENCOUNTER — Ambulatory Visit (INDEPENDENT_AMBULATORY_CARE_PROVIDER_SITE_OTHER): Payer: 59 | Admitting: Orthopaedic Surgery

## 2016-01-09 DIAGNOSIS — M7062 Trochanteric bursitis, left hip: Secondary | ICD-10-CM

## 2016-01-10 ENCOUNTER — Ambulatory Visit: Payer: 59 | Attending: Orthopaedic Surgery | Admitting: Physical Therapy

## 2016-01-10 ENCOUNTER — Encounter: Payer: Self-pay | Admitting: Physical Therapy

## 2016-01-10 DIAGNOSIS — M6281 Muscle weakness (generalized): Secondary | ICD-10-CM | POA: Insufficient documentation

## 2016-01-10 DIAGNOSIS — M25552 Pain in left hip: Secondary | ICD-10-CM | POA: Insufficient documentation

## 2016-01-10 DIAGNOSIS — M62838 Other muscle spasm: Secondary | ICD-10-CM | POA: Insufficient documentation

## 2016-01-10 NOTE — Therapy (Signed)
[facility] [address] [place], [place], [address] Phone: [phone]   Fax:  [phone]  Physical Therapy Evaluation  Patient Details  Name: Albert [name] MRN: [id number] Date of Birth: June 26, 1955 Referring Provider: Dr. Jean Rosenthal  Encounter Date: 01/10/2016      PT End of Session - 01/10/16 1517    Visit Number 1   Date for PT Re-Evaluation 03/06/16   PT Start Time 1445   PT Stop Time 1518   PT Time Calculation (min) 33 min   Activity Tolerance Patient tolerated treatment well   Behavior During Therapy [facility] for tasks assessed/performed      Past Medical History:  Diagnosis Date  . GERD (gastroesophageal reflux disease)     Past Surgical History:  Procedure Laterality Date  . INGUINAL HERNIA REPAIR    . INGUINAL HERNIA REPAIR Left 03/21/2015   Procedure: OPEN LEFT INGUINAL HERNIA REPAIR WITH MESH;  Surgeon: Coralie Keens, MD;  Location: [place];  Service: General;  Laterality: Left;  . INSERTION OF MESH Left 03/21/2015   Procedure: INSERTION OF MESH;  Surgeon: Coralie Keens, MD;  Location: [place];  Service: General;  Laterality: Left;  . KNEE SURGERY    . ROTATOR CUFF REPAIR      There were no vitals filed for this visit.       Subjective Assessment - 01/10/16 1448    Subjective Pateint reports several years ago had pain in ball and socket.  MRI of back showed a mild bulge and MRI of left hip was negative.  Patient tried 5 injections but did not help. A few weeks ago the left leg has become intermittent numbness.    Pertinent History None   Patient Stated Goals reduce pain and get off Advil   Currently in Pain? Yes   Pain Score 8    Pain Location Hip   Pain Orientation Left   Pain Descriptors / Indicators Dull   Pain Type Chronic pain   Pain Onset More than a month ago   Pain Frequency Intermittent   Aggravating Factors  not sure   Pain Relieving Factors Advil            [facility] PT Assessment - 01/10/16 0001      Assessment   Medical Diagnosis Left hip trochanteric bursitis   Referring Provider Dr. Jean Rosenthal   Onset Date/Surgical Date 12/31/13   Prior Therapy none     Precautions   Precautions None     Restrictions   Weight Bearing Restrictions No     Balance Screen   Has the patient fallen in the past 6 months No   Has the patient had a decrease in activity level because of a fear of falling?  No   Is the patient reluctant to leave their home because of a fear of falling?  No     Home Ecologist residence     Prior Function   Level of Independence Independent   Vocation Full time employment   Vocation Requirements climb ladders, walk, lifting     Cognition   Overall Cognitive Status Within Functional Limits for tasks assessed     Observation/Other Assessments   Focus on Therapeutic Outcomes (FOTO)  30% limitation  goal is 28% limitation     Posture/Postural Control   Posture/Postural Control No significant limitations     ROM / Strength   AROM / PROM / Strength  Strength;PROM;AROM     AROM   Overall AROM Comments extension decreased by 25%     Strength   Overall Strength Comments left hip rotators 4/5,      Flexibility   Soft Tissue Assessment /Muscle Length yes  bil. hip flexors tight     Palpation   SI assessment  ASIS are equal   Palpation comment palpable tenderness located on posterior left hip     Special Tests    Special Tests Hip Special Tests   Hip Special Tests  Saralyn Pilar [facility]) Test     Saralyn Pilar [facility]) Test   Findings Negative   Side Left   Comments no pain                           PT Education - 01/10/16 1516    Education provided Yes   Education Details flexibility exercise; information on iontophoresis   Person(s) Educated Patient   Methods Explanation;Demonstration;Handout   Comprehension  Returned demonstration;Verbalized understanding          PT Short Term Goals - 01/10/16 1523      PT SHORT TERM GOAL #1   Title independent with initial HEP   Time 4   Period Weeks   Status New     PT SHORT TERM GOAL #2   Title pain with laying on left hip decreased >/= 25%   Time 4   Period Weeks   Status New           PT Long Term Goals - 01/10/16 1513      PT LONG TERM GOAL #1   Title lay on left side with pain decreased >/= 75% using pillows   Time 8   Period Weeks   Status New     PT LONG TERM GOAL #2   Title take 1 or less Advil per day   Time 8   Period Weeks   Status New     PT LONG TERM GOAL #3   Title independent with HEP    Time 8   Period Weeks   Status New               Plan - 01/10/16 1518    Clinical Impression Statement Patient is a 60 year old male with chronic left hip pain for 2 years.  Patient reports he was doing pushups and had pain the next day.  Patient reports his pain is intermittent at level 8/10. Patient pain is worse with laying on left hip. Patient reports Advil abolishes his pain.  Patient goal is to not take 3 Advil per day. He would like to reduce to 1 Advil per day.  Left hip rotators strength is 4/5. Patient has tight hip flexors and rotators.  Palpable tendeness located in  posterior left hip.  Lumbar extension decreased by 25%. Patient is a low complexity evaluation due to stable pain and no comorbidities that will impact treatment.  Patient will benefit from skilled therapy to reduce pain.    Rehab Potential Excellent   Clinical Impairments Affecting Rehab Potential None   PT Frequency 1x / week   PT Duration 8 weeks   PT Treatment/Interventions Cryotherapy;Electrical Stimulation;Iontophoresis 4mg /ml Dexamethasone;Ultrasound;Moist Heat;Therapeutic activities;Therapeutic exercise;Neuromuscular re-education;Patient/family education;Passive range of motion;Manual techniques;Dry needling  home TENS unit   PT Next Visit  Plan see if ionto helped, left hip joints, soft tissue work to posterior left hip, hip rotator strength, modalities as needed   PT  Home Exercise Plan progress as needed   Recommended Other Services None   Consulted and Agree with Plan of Care Patient      Patient will benefit from skilled therapeutic intervention in order to improve the following deficits and impairments:  Pain, Decreased strength, Increased muscle spasms  Visit Diagnosis: Pain in left hip  Other muscle spasm  Muscle weakness (generalized)     Problem List Patient Active Problem List   Diagnosis Date Noted  . ESOPHAGEAL REFLUX 08/02/2008  . PERSONAL HISTORY OF COLONIC POLYPS 08/02/2008   Earlie Counts, PT 01/10/16 3:26 PM   [facility] [address], [place], [place], [address] Phone: [phone]   Fax:  [phone]  Name: Albert [name] MRN: [id number] Date of Birth: 12-23-1955

## 2016-01-10 NOTE — Patient Instructions (Addendum)
Hamstring Step 1    Straighten left knee. Keep knee level with other knee or on bolster. Hold _30__ seconds. Relax knee by returning foot to start. Repeat _2__ times.  Copyright  VHI. All rights reserved.  Adductor, Sitting    Sit with legs apart. Slide hands forward. Hold _30__ seconds. Repeat __2_ times per session. Do _1__ sessions per day.  Copyright  VHI. All rights reserved.  Piriformis Stretch, Sitting    Sit, one ankle on opposite knee, same-side hand on crossed knee. Push down on knee, keeping spine straight. Lean torso forward, with flat back, until tension is felt in hamstrings and gluteals of crossed-leg side. Hold 30___ seconds.  Repeat _2__ times per session. Do ___ sessions per day.  Copyright  VHI. All rights reserved.  IONTOPHORESIS PATIENT PRECAUTIONS & CONTRAINDICATIONS:  . Redness under one or both electrodes can occur.  This characterized by a uniform redness that usually disappears within 12 hours of treatment. . Small pinhead size blisters may result in response to the drug.  Contact your physician if the problem persists more than 24 hours. . On rare occasions, iontophoresis therapy can result in temporary skin reactions such as rash, inflammation, irritation or burns.  The skin reactions may be the result of individual sensitivity to the ionic solution used, the condition of the skin at the start of treatment, reaction to the materials in the electrodes, allergies or sensitivity to dexamethasone, or a poor connection between the patch and your skin.  Discontinue using iontophoresis if you have any of these reactions and report to your therapist. . Remove the Patch or electrodes if you have any undue sensation of pain or burning during the treatment and report discomfort to your therapist. . Tell your Therapist if you have had known adverse reactions to the application of electrical current. . If using the Patch, the LED light will turn off when treatment  is complete and the patch can be removed.  Approximate treatment time is 1-3 hours.  Remove the patch when light goes off or after 6 hours. . The Patch can be worn during normal activity, however excessive motion where the electrodes have been placed can cause poor contact between the skin and the electrode or uneven electrical current resulting in greater risk of skin irritation. Marland Kitchen Keep out of the reach of children.   . DO NOT use if you have a cardiac pacemaker or any other electrically sensitive implanted device. . DO NOT use if you have a known sensitivity to dexamethasone. . DO NOT use during Magnetic Resonance Imaging (MRI). . DO NOT use over broken or compromised skin (e.g. sunburn, cuts, or acne) due to the increased risk of skin reaction. . DO NOT SHAVE over the area to be treated:  To establish good contact between the Patch and the skin, excessive hair may be clipped. . DO NOT place the Patch or electrodes on or over your eyes, directly over your heart, or brain. . DO NOT reuse the Patch or electrodes as this may cause burns to occur. [place] [address] [place], [place] [address] Phone # [phone] Fax [phone]

## 2016-01-16 DIAGNOSIS — R7309 Other abnormal glucose: Secondary | ICD-10-CM | POA: Diagnosis not present

## 2016-01-16 DIAGNOSIS — Z0000 Encounter for general adult medical examination without abnormal findings: Secondary | ICD-10-CM | POA: Diagnosis not present

## 2016-01-17 ENCOUNTER — Encounter: Payer: Self-pay | Admitting: Physical Therapy

## 2016-01-17 ENCOUNTER — Ambulatory Visit: Payer: 59 | Admitting: Physical Therapy

## 2016-01-17 DIAGNOSIS — M25552 Pain in left hip: Secondary | ICD-10-CM

## 2016-01-17 DIAGNOSIS — M6281 Muscle weakness (generalized): Secondary | ICD-10-CM | POA: Diagnosis not present

## 2016-01-17 DIAGNOSIS — M62838 Other muscle spasm: Secondary | ICD-10-CM

## 2016-01-17 NOTE — Therapy (Signed)
[facility] [address] [place], [place], [address] Phone: [phone]   Fax:  [phone]  Physical Therapy Treatment  Patient Details  Name: Albert [name] MRN: [id number] Date of Birth: Oct 15, 1955 Referring Provider: Dr. Jean Rosenthal  Encounter Date: 01/17/2016      PT End of Session - 01/17/16 1450    Visit Number 2   Date for PT Re-Evaluation 03/06/16   PT Start Time 1446   PT Stop Time 1545   PT Time Calculation (min) 59 min   Activity Tolerance Patient tolerated treatment well   Behavior During Therapy [facility] for tasks assessed/performed      Past Medical History:  Diagnosis Date  . GERD (gastroesophageal reflux disease)     Past Surgical History:  Procedure Laterality Date  . INGUINAL HERNIA REPAIR    . INGUINAL HERNIA REPAIR Left 03/21/2015   Procedure: OPEN LEFT INGUINAL HERNIA REPAIR WITH MESH;  Surgeon: Coralie Keens, MD;  Location: [place];  Service: General;  Laterality: Left;  . INSERTION OF MESH Left 03/21/2015   Procedure: INSERTION OF MESH;  Surgeon: Coralie Keens, MD;  Location: [place];  Service: General;  Laterality: Left;  . KNEE SURGERY    . ROTATOR CUFF REPAIR      There were no vitals filed for this visit.      Subjective Assessment - 01/17/16 1449    Subjective Pt reports having some increased hip pain but has already taken pain meds about a half hour ago.    Pertinent History None   Patient Stated Goals reduce pain and get off Advil   Currently in Pain? Yes   Pain Score 4    Pain Location Hip   Pain Orientation Left   Pain Descriptors / Indicators Dull   Pain Type Chronic pain                         [facility] Adult PT Treatment/Exercise - 01/17/16 0001      Exercises   Exercises Knee/Hip;Lumbar     Knee/Hip Exercises: Stretches   Passive Hamstring Stretch Both;2 reps;10 seconds   ITB Stretch Both;1  rep;10 seconds   Piriformis Stretch Both;1 rep;10 seconds     Knee/Hip Exercises: Supine   Straight Leg Raises Strengthening;Both;2 sets;10 reps     Knee/Hip Exercises: Sidelying   Hip ABduction Strengthening;Both;2 sets;10 reps   Hip ADduction Strengthening;Both;2 sets;10 reps     Knee/Hip Exercises: Prone   Hamstring Curl 10 reps  3 directions red tband Bil   Hamstring Curl Limitations Red tband   Hip Extension Strengthening;Both;2 sets;10 reps     Modalities   Modalities Electrical Stimulation;Moist Heat;Iontophoresis     Moist Heat Therapy   Number Minutes Moist Heat 15 Minutes   Moist Heat Location Hip  Left     Electrical Stimulation   Electrical Stimulation Location Lt hip   Electrical Stimulation Action IFC   Electrical Stimulation Parameters To tolerance   Electrical Stimulation Goals Pain     Iontophoresis   Type of Iontophoresis Dexamethasone   Location Lt hip   Dose 1 mL   Time 6 hour patch  #2                PT Education - 01/17/16 1528    Education provided Yes   Education Details Ionotophoresis   Person(s) Educated Patient   Methods Explanation;Demonstration   Comprehension Verbalized understanding  PT Short Term Goals - 01/17/16 1450      PT SHORT TERM GOAL #1   Title independent with initial HEP   Time 4   Period Weeks   Status On-going     PT SHORT TERM GOAL #2   Title pain with laying on left hip decreased >/= 25%   Time 4   Period Weeks   Status On-going           PT Long Term Goals - 01/17/16 1451      PT LONG TERM GOAL #1   Title lay on left side with pain decreased >/= 75% using pillows   Time 8   Period Weeks   Status On-going     PT LONG TERM GOAL #2   Title take 1 or less Advil per day   Time 8   Period Weeks   Status On-going     PT LONG TERM GOAL #3   Title independent with HEP    Time 8   Period Weeks   Status On-going               Plan - 01/17/16 1528    Clinical  Impression Statement Pt presents with Lt hip tightness and pain. Able to tolerate all strengthening exercises well with good form and no increase in pain. Pt works for [employer] and is very active at work and at home. Pt would benefit from body mechancis training. Pt reports no change with ionto but agreeable to trying again. Pt has very tight hamstrings. Pt will continue to benfit from skilled therapy for hip strenghtening and body mechanics training.    Rehab Potential Excellent   Clinical Impairments Affecting Rehab Potential None   PT Frequency 1x / week   PT Duration 8 weeks   PT Treatment/Interventions Cryotherapy;Electrical Stimulation;Iontophoresis 4mg /ml Dexamethasone;Ultrasound;Moist Heat;Therapeutic activities;Therapeutic exercise;Neuromuscular re-education;Patient/family education;Passive range of motion;Manual techniques;Dry needling   PT Next Visit Plan Hip rotator strength, body mechanics, standing balance and stability.    PT Home Exercise Plan progress as needed   Consulted and Agree with Plan of Care Patient      Patient will benefit from skilled therapeutic intervention in order to improve the following deficits and impairments:  Pain, Decreased strength, Increased muscle spasms  Visit Diagnosis: Pain in left hip  Other muscle spasm  Muscle weakness (generalized)     Problem List Patient Active Problem List   Diagnosis Date Noted  . ESOPHAGEAL REFLUX 08/02/2008  . PERSONAL HISTORY OF COLONIC POLYPS 08/02/2008    Mikle Bosworth PTA 01/17/2016, 3:41 PM  [facility] [address] [place], [place], [address] Phone: [phone]   Fax:  [phone]  Name: REXTON HELSER MRN: [id number] Date of Birth: 10/29/1955

## 2016-01-17 NOTE — Patient Instructions (Signed)

## 2016-01-23 DIAGNOSIS — E782 Mixed hyperlipidemia: Secondary | ICD-10-CM | POA: Diagnosis not present

## 2016-01-23 DIAGNOSIS — R7301 Impaired fasting glucose: Secondary | ICD-10-CM | POA: Diagnosis not present

## 2016-01-23 DIAGNOSIS — K219 Gastro-esophageal reflux disease without esophagitis: Secondary | ICD-10-CM | POA: Diagnosis not present

## 2016-01-23 DIAGNOSIS — Z0000 Encounter for general adult medical examination without abnormal findings: Secondary | ICD-10-CM | POA: Diagnosis not present

## 2016-01-23 MED FILL — NexIUM 40 MG CPDR: 40 | 90 days supply | Qty: 90 | Fill #0

## 2016-01-24 ENCOUNTER — Ambulatory Visit: Payer: 59 | Attending: Orthopaedic Surgery | Admitting: Physical Therapy

## 2016-01-24 DIAGNOSIS — M62838 Other muscle spasm: Secondary | ICD-10-CM | POA: Insufficient documentation

## 2016-01-24 DIAGNOSIS — M6281 Muscle weakness (generalized): Secondary | ICD-10-CM | POA: Insufficient documentation

## 2016-01-24 DIAGNOSIS — M25552 Pain in left hip: Secondary | ICD-10-CM | POA: Insufficient documentation

## 2016-01-24 NOTE — Patient Instructions (Signed)
Abduction: Clam (Eccentric) - Side-Lying   Lie on side with knees bent. Lift top knee, keeping feet together. Keep trunk steady. Slowly lower for 3-5 seconds. _15__ reps per set, ___1 sets per day, __7 days per wee  Copyright  VHI. All rights reserved.    Trigger Point Dry Needling  . What is Trigger Point Dry Needling (DN)? o DN is a physical therapy technique used to treat muscle pain and dysfunction. Specifically, DN helps deactivate muscle trigger points (muscle knots).  o A thin filiform needle is used to penetrate the skin and stimulate the underlying trigger point. The goal is for a local twitch response (LTR) to occur and for the trigger point to relax. No medication of any kind is injected during the procedure.   . What Does Trigger Point Dry Needling Feel Like?  o The procedure feels different for each individual patient. Some patients report that they do not actually feel the needle enter the skin and overall the process is not painful. Very mild bleeding may occur. However, many patients feel a deep cramping in the muscle in which the needle was inserted. This is the local twitch response.   Marland Kitchen How Will I feel after the treatment? o Soreness is normal, and the onset of soreness may not occur for a few hours. Typically this soreness does not last longer than two days.  o Bruising is uncommon, however; ice can be used to decrease any possible bruising.  o In rare cases feeling tired or nauseous after the treatment is normal. In addition, your symptoms may get worse before they get better, this period will typically not last longer than 24 hours.   . What Can I do After My Treatment? o Increase your hydration by drinking more water for the next 24 hours. o You may place ice or heat on the areas treated that have become sore, however, do not use heat on inflamed or bruised areas. Heat often brings more relief post needling. o You can continue your regular activities, but vigorous  activity is not recommended initially after the treatment for 24 hours. o DN is best combined with other physical therapy such as strengthening, stretching, and other therapies.     Ruben Im PT [facility] [address], [place], [place] [address] Phone # [phone] Fax [phone]

## 2016-01-24 NOTE — Therapy (Signed)
[facility] [address] [place], [place], [address] Phone: [phone]   Fax:  [phone]  Physical Therapy Treatment  Patient Details  Name: Albert [name] MRN: [id number] Date of Birth: 1955/05/09 Referring Provider: Dr. Jean Rosenthal  Encounter Date: 01/24/2016      PT End of Session - 01/24/16 1655    Visit Number 3   Date for PT Re-Evaluation 03/06/16   PT Start Time 1615   PT Stop Time 1705   PT Time Calculation (min) 50 min   Activity Tolerance Patient tolerated treatment well      Past Medical History:  Diagnosis Date  . GERD (gastroesophageal reflux disease)     Past Surgical History:  Procedure Laterality Date  . INGUINAL HERNIA REPAIR    . INGUINAL HERNIA REPAIR Left 03/21/2015   Procedure: OPEN LEFT INGUINAL HERNIA REPAIR WITH MESH;  Surgeon: Coralie Keens, MD;  Location: [place];  Service: General;  Laterality: Left;  . INSERTION OF MESH Left 03/21/2015   Procedure: INSERTION OF MESH;  Surgeon: Coralie Keens, MD;  Location: [place];  Service: General;  Laterality: Left;  . KNEE SURGERY    . ROTATOR CUFF REPAIR      There were no vitals filed for this visit.      Subjective Assessment - 01/24/16 1612    Subjective Patient reports I'm so-so,   States not getting better.  Anterior thigh pain, lateral hip.  My blood test came back with too much calcium.  The other night I felt like water was running down my leg.     Currently in Pain? Yes   Pain Score 5    Pain Location Hip   Pain Orientation Left                         [facility] Adult PT Treatment/Exercise - 01/24/16 0001      Knee/Hip Exercises: Standing   SLS right and left with mirror feedback to decrease pelvic drop     Knee/Hip Exercises: Sidelying   Clams 15x left     Moist Heat Therapy   Number Minutes Moist Heat 15 Minutes   Moist Heat Location Hip     Electrical Stimulation   Electrical Stimulation Location left hip    Electrical Stimulation Action IFC   Electrical Stimulation Parameters 14 ma 15 min sidelying   Electrical Stimulation Goals Pain     Manual Therapy   Manual Therapy Joint mobilization;Soft tissue mobilization;Muscle Energy Technique   Joint Mobilization left long axis hip distraction, inferior, AP in IR grade 3 3x 20 sec   Soft tissue mobilization gluteals, vastus lateralis, TFL          Trigger Point Dry Needling - 01/24/16 1654    Consent Given? Yes   Education Handout Provided Yes   Muscles Treated Lower Body Gluteus minimus;Gluteus maximus;Tensor fascia lata;Quadriceps   Gluteus Maximus Response Twitch response elicited;Palpable increased muscle length   Gluteus Minimus Response Twitch response elicited;Palpable increased muscle length   Tensor Fascia Lata Response Twitch response elicited;Palpable increased muscle length   Quadriceps Response Twitch response elicited;Palpable increased muscle length      Left only.        PT Education - 01/24/16 1650    Education provided Yes   Education Details clams; dry needling after care   Person(s) Educated Patient   Methods Explanation;Demonstration;Handout   Comprehension Verbalized understanding;Returned demonstration  PT Short Term Goals - 01/24/16 1658      PT SHORT TERM GOAL #1   Title independent with initial HEP   Time 4   Period Weeks   Status On-going     PT SHORT TERM GOAL #2   Title pain with laying on left hip decreased >/= 25%   Time 4   Period Weeks   Status On-going           PT Long Term Goals - 01/24/16 1659      PT LONG TERM GOAL #1   Title lay on left side with pain decreased >/= 75% using pillows   Time 8   Period Weeks   Status On-going     PT LONG TERM GOAL #2   Title take 1 or less Advil per day   Time 8   Period Weeks   Status On-going     PT LONG TERM GOAL #3   Title independent with HEP     Time 8   Period Weeks   Status On-going               Plan - 01/24/16 1655    Clinical Impression Statement The patient reports no change thus far.  Pelvic drop and lateral trunk lean on left with single leg standing indicating gluteal weakness.  Tender points palpated in left gluteals and vastus lateralis.  The patient was receptive to dry needling and manual therapy.  Improved muscle length following treatment.  Therapist closely monitoring response with all interventions.    PT Next Visit Plan assess response to DN#1;  discontinue ionto secondary to patient reports no benefit;  gluteal strengthening;  psoas stretch;  e-stim/heat as needed      Patient will benefit from skilled therapeutic intervention in order to improve the following deficits and impairments:     Visit Diagnosis: Pain in left hip  Other muscle spasm  Muscle weakness (generalized)     Problem List Patient Active Problem List   Diagnosis Date Noted  . ESOPHAGEAL REFLUX 08/02/2008  . PERSONAL HISTORY OF COLONIC POLYPS 08/02/2008   Ruben Im, PT 01/24/16 5:00 PM Phone: [phone] Fax: [phone]  Alvera Singh 01/24/2016, 5:00 PM  [facility] [address], [place], [place], [address] Phone: [phone]   Fax:  [phone]  Name: Albert [name] MRN: [id number] Date of Birth: [birth year]

## 2016-01-30 ENCOUNTER — Ambulatory Visit: Payer: 59 | Admitting: Physical Therapy

## 2016-01-30 ENCOUNTER — Encounter: Payer: Self-pay | Admitting: Physical Therapy

## 2016-01-30 DIAGNOSIS — M62838 Other muscle spasm: Secondary | ICD-10-CM | POA: Diagnosis not present

## 2016-01-30 DIAGNOSIS — M25552 Pain in left hip: Secondary | ICD-10-CM | POA: Diagnosis not present

## 2016-01-30 DIAGNOSIS — M6281 Muscle weakness (generalized): Secondary | ICD-10-CM | POA: Diagnosis not present

## 2016-01-30 NOTE — Therapy (Signed)
[facility] [address], [place], [place], [address] Phone: [phone]   Fax:  [phone]  Physical Therapy Treatment  Patient Details  Name: Albert [name] MRN: [id number] Date of Birth: 03-Nov-1955 Referring Provider: Dr. Jean Rosenthal  Encounter Date: 01/30/2016      PT End of Session - 01/30/16 1537    Visit Number 4   Date for PT Re-Evaluation 03/06/16   PT Start Time [id number]   PT Stop Time 1630   PT Time Calculation (min) 55 min   Activity Tolerance Patient tolerated treatment well   Behavior During Therapy [facility] for tasks assessed/performed      Past Medical History:  Diagnosis Date  . GERD (gastroesophageal reflux disease)     Past Surgical History:  Procedure Laterality Date  . INGUINAL HERNIA REPAIR    . INGUINAL HERNIA REPAIR Left 03/21/2015   Procedure: OPEN LEFT INGUINAL HERNIA REPAIR WITH MESH;  Surgeon: Coralie Keens, MD;  Location: [place];  Service: General;  Laterality: Left;  . INSERTION OF MESH Left 03/21/2015   Procedure: INSERTION OF MESH;  Surgeon: Coralie Keens, MD;  Location: [place];  Service: General;  Laterality: Left;  . KNEE SURGERY    . ROTATOR CUFF REPAIR      There were no vitals filed for this visit.      Subjective Assessment - 01/30/16 1536    Subjective Pt reports pain not so bad today. Responded well to dry needleing   Currently in Pain? Yes   Pain Score 4    Pain Location Hip   Pain Orientation Left   Pain Descriptors / Indicators Dull   Pain Onset More than a month ago   Pain Frequency Intermittent                         [facility] Adult PT Treatment/Exercise - 01/30/16 0001      Knee/Hip Exercises: Stretches   Passive Hamstring Stretch Both;2 reps;10 seconds   Quad Stretch Both;2 reps;10 seconds  prone with half foam roll under knee     Knee/Hip Exercises: Standing   Hip Flexion  Stengthening;Both;2 sets;10 reps   Hip Abduction Stengthening;Both;2 sets;10 reps   Hip Extension Stengthening;Both;2 sets;10 reps   Other Standing Knee Exercises [place] glute activation  Bil x10     Knee/Hip Exercises: Supine   Bridges with [employer] Strengthening;2 sets;10 reps     Knee/Hip Exercises: Sidelying   Clams 15x left     Modalities   Modalities Electrical Stimulation;Moist Heat;Iontophoresis     Moist Heat Therapy   Number Minutes Moist Heat 15 Minutes   Moist Heat Location Hip     Electrical Stimulation   Electrical Stimulation Location left hip    Electrical Stimulation Action IFC   Electrical Stimulation Parameters To tolerance   Electrical Stimulation Goals Pain     Manual Therapy   Manual Therapy Taping   Manual therapy comments Rt hip stretch over GT, facilitation of glutes                  PT Short Term Goals - 01/30/16 1537      PT SHORT TERM GOAL #1   Title independent with initial HEP   Time 4   Period Weeks   Status On-going     PT SHORT TERM GOAL #2   Title pain with laying on left hip decreased >/= 25%   Time  4   Period Weeks   Status On-going           PT Long Term Goals - 01/30/16 1538      PT LONG TERM GOAL #1   Title lay on left side with pain decreased >/= 75% using pillows   Time 8   Period Weeks   Status On-going     PT LONG TERM GOAL #2   Title take 1 or less Advil per day   Time 8   Period Weeks   Status On-going     PT LONG TERM GOAL #3   Title independent with HEP    Time 8   Period Weeks   Status On-going               Plan - 01/30/16 1707    Clinical Impression Statement Pt responded well to dry needling. Has decreased glute activation, tight hamstrings, and tight hip flexors. Pt will continue to benefit from skilled therapy for Bil hip and core strengthening.    Rehab Potential Excellent   Clinical Impairments Affecting Rehab Potential None   PT Frequency 1x / week   PT  Duration 8 weeks   PT Treatment/Interventions Cryotherapy;Electrical Stimulation;Iontophoresis 4mg /ml Dexamethasone;Ultrasound;Moist Heat;Therapeutic activities;Therapeutic exercise;Neuromuscular re-education;Patient/family education;Passive range of motion;Manual techniques;Dry needling   PT Next Visit Plan Asses responce to tapeing, continue glute strengthening and hip flexor stretching   PT Home Exercise Plan progress as needed   Consulted and Agree with Plan of Care Patient      Patient will benefit from skilled therapeutic intervention in order to improve the following deficits and impairments:  Pain, Decreased strength, Increased muscle spasms  Visit Diagnosis: Pain in left hip  Other muscle spasm  Muscle weakness (generalized)     Problem List Patient Active Problem List   Diagnosis Date Noted  . ESOPHAGEAL REFLUX 08/02/2008  . PERSONAL HISTORY OF COLONIC POLYPS 08/02/2008    Mikle Bosworth PTA 01/30/2016, 5:10 PM  [facility] [address], [place], [place], [address] Phone: [phone]   Fax:  [phone]  Name: Albert [name] MRN: [id number] Date of Birth: 1955-04-02

## 2016-02-07 ENCOUNTER — Encounter: Payer: Self-pay | Admitting: Physical Therapy

## 2016-02-07 ENCOUNTER — Ambulatory Visit: Payer: 59 | Admitting: Physical Therapy

## 2016-02-07 DIAGNOSIS — M6281 Muscle weakness (generalized): Secondary | ICD-10-CM | POA: Diagnosis not present

## 2016-02-07 DIAGNOSIS — M62838 Other muscle spasm: Secondary | ICD-10-CM | POA: Diagnosis not present

## 2016-02-07 DIAGNOSIS — M25552 Pain in left hip: Secondary | ICD-10-CM

## 2016-02-07 NOTE — Therapy (Signed)
[facility] [address] [place], [place], [address] Phone: [phone]   Fax:  [phone]  Physical Therapy Treatment  Patient Details  Name: Albert [name] MRN: [id number] Date of Birth: 1955/04/03 Referring Provider: Dr. Jean Rosenthal  Encounter Date: 02/07/2016      PT End of Session - 02/07/16 1535    Visit Number 5   Date for PT Re-Evaluation 03/06/16   PT Start Time 1531   PT Stop Time 1632   PT Time Calculation (min) 61 min   Activity Tolerance Patient tolerated treatment well   Behavior During Therapy [facility] for tasks assessed/performed      Past Medical History:  Diagnosis Date  . GERD (gastroesophageal reflux disease)     Past Surgical History:  Procedure Laterality Date  . INGUINAL HERNIA REPAIR    . INGUINAL HERNIA REPAIR Left 03/21/2015   Procedure: OPEN LEFT INGUINAL HERNIA REPAIR WITH MESH;  Surgeon: Coralie Keens, MD;  Location: [place];  Service: General;  Laterality: Left;  . INSERTION OF MESH Left 03/21/2015   Procedure: INSERTION OF MESH;  Surgeon: Coralie Keens, MD;  Location: [place];  Service: General;  Laterality: Left;  . KNEE SURGERY    . ROTATOR CUFF REPAIR      There were no vitals filed for this visit.      Subjective Assessment - 02/07/16 1534    Subjective Pt reports hip hurting about the same as it usually does. Pt mentioned recently having pain in Bil low back that radiates down Bil lateral hip down to knees. Pt is going to see an endocrinaologist next week reguarding thyroid.    Pertinent History None   Patient Stated Goals reduce pain and get off Advil   Currently in Pain? Yes   Pain Score 4    Pain Location Hip   Pain Orientation Left   Pain Descriptors / Indicators Dull   Pain Onset More than a month ago   Pain Frequency Intermittent   Aggravating Factors  not sure   Pain Relieving Factors Advil                          [facility] Adult PT Treatment/Exercise - 02/07/16 0001      Lumbar Exercises: Machines for Strengthening   Cybex Knee Extension 3x10 #25   Cybex Knee Flexion 3x10 #25   Leg Press Seat 6 #95 3x10 Bil; #55 single leg     Lumbar Exercises: Seated   Sit to Stand 20 reps  with yellow band     Knee/Hip Exercises: Standing   SLS right and left with mirror feedback to decrease pelvic drop     Knee/Hip Exercises: Supine   Bridges with Ball Squeeze Strengthening;2 sets;10 reps     Knee/Hip Exercises: Sidelying   Clams 15x left  with red band     Knee/Hip Exercises: Prone   Hamstring Curl 10 reps;3 sets  medial lateral and neutral     Modalities   Modalities Electrical Stimulation;Moist Heat;Iontophoresis     Moist Heat Therapy   Number Minutes Moist Heat 15 Minutes   Moist Heat Location Hip     Electrical Stimulation   Electrical Stimulation Location Lt hip  Pt side lying   Electrical Stimulation Action IFC   Electrical Stimulation Parameters To tolerance   Electrical Stimulation Goals Pain     Manual Therapy   Manual Therapy Soft tissue mobilization;Taping  Manual therapy comments Rt hip stretch over GT, facilitation of glutes   Soft tissue mobilization IASTM Lt hip  TFL, gluteals, greator trocanter                  PT Short Term Goals - 02/07/16 1536      PT SHORT TERM GOAL #1   Title independent with initial HEP   Time 4   Period Weeks   Status Achieved     PT SHORT TERM GOAL #2   Title pain with laying on left hip decreased >/= 25%   Time 4   Period Weeks   Status On-going           PT Long Term Goals - 02/07/16 1537      PT LONG TERM GOAL #1   Title lay on left side with pain decreased >/= 75% using pillows   Time 8   Period Weeks   Status On-going     PT LONG TERM GOAL #2   Title take 1 or less Advil per day   Time 8   Period Weeks   Status On-going     PT LONG TERM GOAL #3   Title independent  with HEP    Time 8   Period Weeks   Status On-going               Plan - 02/07/16 1617    Clinical Impression Statement Pt continues to have hip pain and recently c/o low back pain radiating down Bil hips to knees. Pt has very tight hamstrings and decreased glute activation. Pt will continue to benfit from skilled therapy for progression on Bil LE strength and stability.    Rehab Potential Excellent   Clinical Impairments Affecting Rehab Potential None   PT Frequency 1x / week   PT Duration 8 weeks   PT Treatment/Interventions Cryotherapy;Electrical Stimulation;Iontophoresis 4mg /ml Dexamethasone;Ultrasound;Moist Heat;Therapeutic activities;Therapeutic exercise;Neuromuscular re-education;Patient/family education;Passive range of motion;Manual techniques;Dry needling   PT Next Visit Plan hamstring stretching, quad strengthening, modalities as needed   Consulted and Agree with Plan of Care Patient      Patient will benefit from skilled therapeutic intervention in order to improve the following deficits and impairments:  Pain, Decreased strength, Increased muscle spasms  Visit Diagnosis: Pain in left hip  Other muscle spasm  Muscle weakness (generalized)     Problem List Patient Active Problem List   Diagnosis Date Noted  . ESOPHAGEAL REFLUX 08/02/2008  . PERSONAL HISTORY OF COLONIC POLYPS 08/02/2008    Mikle Bosworth PTA 02/07/2016, 4:35 PM  [facility] [address] [place], [place], [address] Phone: [phone]   Fax:  [phone]  Name: Albert [name] MRN: [id number] Date of Birth: Oct 01, 1955

## 2016-02-11 ENCOUNTER — Ambulatory Visit: Payer: 59

## 2016-02-11 DIAGNOSIS — M6281 Muscle weakness (generalized): Secondary | ICD-10-CM

## 2016-02-11 DIAGNOSIS — M25552 Pain in left hip: Secondary | ICD-10-CM | POA: Diagnosis not present

## 2016-02-11 DIAGNOSIS — M62838 Other muscle spasm: Secondary | ICD-10-CM

## 2016-02-11 NOTE — Therapy (Signed)
[facility] [address] [place], [place], [address] Phone: [phone]   Fax:  [phone]  Physical Therapy Treatment  Patient Details  Name: Albert [name] MRN: [id number] Date of Birth: May 25, 1955 Referring Provider: Dr. Jean Rosenthal  Encounter Date: 02/11/2016      PT End of Session - 02/11/16 1652    Visit Number 6   Date for PT Re-Evaluation 03/06/16   PT Start Time 1616  dry needling   PT Stop Time 1659   PT Time Calculation (min) 43 min   Activity Tolerance Patient tolerated treatment well   Behavior During Therapy [facility] for tasks assessed/performed      Past Medical History:  Diagnosis Date  . GERD (gastroesophageal reflux disease)     Past Surgical History:  Procedure Laterality Date  . INGUINAL HERNIA REPAIR    . INGUINAL HERNIA REPAIR Left 03/21/2015   Procedure: OPEN LEFT INGUINAL HERNIA REPAIR WITH MESH;  Surgeon: Coralie Keens, MD;  Location: [place];  Service: General;  Laterality: Left;  . INSERTION OF MESH Left 03/21/2015   Procedure: INSERTION OF MESH;  Surgeon: Coralie Keens, MD;  Location: [place];  Service: General;  Laterality: Left;  . KNEE SURGERY    . ROTATOR CUFF REPAIR      There were no vitals filed for this visit.      Subjective Assessment - 02/11/16 1614    Subjective Things are about the same.  Needling helped some.    Currently in Pain? Yes   Pain Score 3    Pain Location Hip   Pain Orientation Left   Pain Descriptors / Indicators Sore   Pain Type Chronic pain   Pain Onset More than a month ago   Pain Frequency Intermittent   Aggravating Factors  when Advil runs out   Pain Relieving Factors Advil                         [facility] Adult PT Treatment/Exercise - 02/11/16 0001      Lumbar Exercises: Stretches   Active Hamstring Stretch 3 reps;20 seconds   Single Knee to Chest Stretch 3 reps;20  seconds   Piriformis Stretch 3 reps;20 seconds     Modalities   Modalities Electrical Stimulation     Moist Heat Therapy   Number Minutes Moist Heat 15 Minutes   Moist Heat Location Hip     Electrical Stimulation   Electrical Stimulation Location Lt gluteals   Electrical Stimulation Action IFC   Electrical Stimulation Parameters 15 minutes   Electrical Stimulation Goals Pain     Manual Therapy   Manual Therapy Soft tissue mobilization;Myofascial release   Soft tissue mobilization soft tissue elongation and trigger point release to Lt gluteals and proximal ITB          Trigger Point Dry Needling - 02/11/16 1639    Consent Given? Yes   Muscles Treated Lower Body Gluteus minimus;Gluteus maximus   Gluteus Maximus Response Twitch response elicited;Palpable increased muscle length   Gluteus Minimus Response Twitch response elicited;Palpable increased muscle length   Tensor Fascia Lata Response Twitch response elicited;Palpable increased muscle length                PT Short Term Goals - 02/11/16 1619      PT SHORT TERM GOAL #1   Title independent with initial HEP   Status Achieved     PT SHORT TERM  GOAL #2   Title pain with laying on left hip decreased >/= 25%   Time 4   Period Weeks   Status On-going           PT Long Term Goals - 02/07/16 1537      PT LONG TERM GOAL #1   Title lay on left side with pain decreased >/= 75% using pillows   Time 8   Period Weeks   Status On-going     PT LONG TERM GOAL #2   Title take 1 or less Advil per day   Time 8   Period Weeks   Status On-going     PT LONG TERM GOAL #3   Title independent with HEP    Time 8   Period Weeks   Status On-going               Plan - 02/11/16 1644    Clinical Impression Statement Pt with continued Lt lateral hip and gluteal pain.  Pt reports 50% improvement in symptoms but is unsure due to taking Advil consistently.  Pt with active trigger points and tension in Lt gluteals  and ITB and responded well to dry needling and manual therapy.  Pt remains weak in the Lt gluteals and is working to strengthen at home.  Pt will continue to benefit from skilled PT for strength, flexiblity, manual and modalities.     Rehab Potential Excellent   PT Frequency 1x / week   PT Duration 8 weeks   PT Treatment/Interventions Cryotherapy;Electrical Stimulation;Iontophoresis 4mg /ml Dexamethasone;Ultrasound;Moist Heat;Therapeutic activities;Therapeutic exercise;Neuromuscular re-education;Patient/family education;Passive range of motion;Manual techniques;Dry needling   PT Next Visit Plan assess response to dry needling, hamstring stretching, quad strengthening, modalities as needed   Consulted and Agree with Plan of Care Patient      Patient will benefit from skilled therapeutic intervention in order to improve the following deficits and impairments:  Pain, Decreased strength, Increased muscle spasms  Visit Diagnosis: Pain in left hip  Other muscle spasm  Muscle weakness (generalized)     Problem List Patient Active Problem List   Diagnosis Date Noted  . ESOPHAGEAL REFLUX 08/02/2008  . PERSONAL HISTORY OF COLONIC POLYPS 08/02/2008    Sigurd Sos, PT 02/11/16 4:55 PM  [facility] [address] [place], [place], [address] Phone: [phone]   Fax:  [phone]  Name: Albert [name] MRN: [id number] Date of Birth: September 07, 1955

## 2016-02-12 DIAGNOSIS — E210 Primary hyperparathyroidism: Secondary | ICD-10-CM | POA: Diagnosis not present

## 2016-02-18 ENCOUNTER — Ambulatory Visit: Payer: 59

## 2016-02-18 DIAGNOSIS — M62838 Other muscle spasm: Secondary | ICD-10-CM | POA: Diagnosis not present

## 2016-02-18 DIAGNOSIS — M6281 Muscle weakness (generalized): Secondary | ICD-10-CM | POA: Diagnosis not present

## 2016-02-18 DIAGNOSIS — M25552 Pain in left hip: Secondary | ICD-10-CM

## 2016-02-18 NOTE — Therapy (Signed)
[facility] [address] [place], [place], [address] Phone: [phone]   Fax:  [phone]  Physical Therapy Treatment  Patient Details  Name: Albert [name] MRN: [id number] Date of Birth: 03-20-1956 Referring Provider: Dr. Jean Rosenthal  Encounter Date: 02/18/2016      PT End of Session - 02/18/16 1657    Visit Number 7   Date for PT Re-Evaluation 03/06/16   PT Start Time 1617   PT Stop Time 1705   PT Time Calculation (min) 48 min   Activity Tolerance Patient tolerated treatment well   Behavior During Therapy [facility] for tasks assessed/performed      Past Medical History:  Diagnosis Date  . GERD (gastroesophageal reflux disease)     Past Surgical History:  Procedure Laterality Date  . INGUINAL HERNIA REPAIR    . INGUINAL HERNIA REPAIR Left 03/21/2015   Procedure: OPEN LEFT INGUINAL HERNIA REPAIR WITH MESH;  Surgeon: Coralie Keens, MD;  Location: [place];  Service: General;  Laterality: Left;  . INSERTION OF MESH Left 03/21/2015   Procedure: INSERTION OF MESH;  Surgeon: Coralie Keens, MD;  Location: [place];  Service: General;  Laterality: Left;  . KNEE SURGERY    . ROTATOR CUFF REPAIR      There were no vitals filed for this visit.      Subjective Assessment - 02/18/16 1620    Subjective No significant change in symptoms.  No difference with dry needling.     Patient Stated Goals reduce pain and get off Advil   Currently in Pain? Yes   Pain Score 3    Pain Location Hip   Pain Orientation Left   Pain Descriptors / Indicators Sore   Pain Type Chronic pain   Pain Onset More than a month ago   Pain Frequency Intermittent   Aggravating Factors  No Advil   Pain Relieving Factors Advil                         [facility] Adult PT Treatment/Exercise - 02/18/16 0001      Lumbar Exercises: Stretches   Active Hamstring Stretch 3 reps;20  seconds   Single Knee to Chest Stretch 3 reps;20 seconds   Piriformis Stretch 3 reps;20 seconds     Lumbar Exercises: Supine   Bridge 20 reps;5 seconds     Lumbar Exercises: Sidelying   Clam 20 reps     Knee/Hip Exercises: Aerobic   Nustep Level 2x 8 minutes     Knee/Hip Exercises: Standing   Hip Abduction Stengthening;Both;2 sets;10 reps   Hip Extension Stengthening;2 sets;10 reps;Both     Modalities   Modalities Electrical Stimulation;Moist Heat     Moist Heat Therapy   Number Minutes Moist Heat 10 Minutes   Moist Heat Location Hip     Electrical Stimulation   Electrical Stimulation Location Lt gluteals   Electrical Stimulation Action IFC   Electrical Stimulation Parameters 10 minutes   Electrical Stimulation Goals Pain                  PT Short Term Goals - 02/18/16 1621      PT SHORT TERM GOAL #1   Title independent with initial HEP   Status Achieved     PT SHORT TERM GOAL #2   Title pain with laying on left hip decreased >/= 25%   Time 4   Period Weeks   Status  On-going           PT Long Term Goals - 02/07/16 1537      PT LONG TERM GOAL #1   Title lay on left side with pain decreased >/= 75% using pillows   Time 8   Period Weeks   Status On-going     PT LONG TERM GOAL #2   Title take 1 or less Advil per day   Time 8   Period Weeks   Status On-going     PT LONG TERM GOAL #3   Title independent with HEP    Time 8   Period Weeks   Status On-going               Plan - 02/18/16 1624    Clinical Impression Statement Pt with little change in symptoms over the past couple of treatments.  Pt is taking Advil consistently and feels better when taking medicine. Pt is weak in the Lt gluteals and tolerated strength exerises well.  Pt repsonds well to e-stim and heat.  Pt will continue to benefit from skilled PT for LE/hip strength and endurance activities.     Rehab Potential Excellent   PT Frequency 1x / week   PT Duration 8 weeks    PT Treatment/Interventions Cryotherapy;Electrical Stimulation;Iontophoresis 4mg /ml Dexamethasone;Ultrasound;Moist Heat;Therapeutic activities;Therapeutic exercise;Neuromuscular re-education;Patient/family education;Passive range of motion;Manual techniques;Dry needling   PT Next Visit Plan Hip/gluteal strength, endurance tasks, flexiblility, modalities for pain.    Consulted and Agree with Plan of Care Patient      Patient will benefit from skilled therapeutic intervention in order to improve the following deficits and impairments:  Pain, Decreased strength, Increased muscle spasms  Visit Diagnosis: Pain in left hip  Other muscle spasm  Muscle weakness (generalized)     Problem List Patient Active Problem List   Diagnosis Date Noted  . ESOPHAGEAL REFLUX 08/02/2008  . PERSONAL HISTORY OF COLONIC POLYPS 08/02/2008    Albert [name], PT 02/18/16 4:59 PM   [facility] [address] [place], [place], [address] Phone: [phone]   Fax:  [phone]  Name: Albert [name] MRN: [id number] Date of Birth: 1955-09-07

## 2016-02-19 DIAGNOSIS — E210 Primary hyperparathyroidism: Secondary | ICD-10-CM | POA: Diagnosis not present

## 2016-02-25 ENCOUNTER — Ambulatory Visit: Payer: 59 | Attending: Orthopaedic Surgery

## 2016-02-25 DIAGNOSIS — M62838 Other muscle spasm: Secondary | ICD-10-CM | POA: Insufficient documentation

## 2016-02-25 DIAGNOSIS — E210 Primary hyperparathyroidism: Secondary | ICD-10-CM | POA: Diagnosis not present

## 2016-02-25 DIAGNOSIS — M6281 Muscle weakness (generalized): Secondary | ICD-10-CM | POA: Diagnosis not present

## 2016-02-25 DIAGNOSIS — M25552 Pain in left hip: Secondary | ICD-10-CM | POA: Diagnosis not present

## 2016-02-25 NOTE — Patient Instructions (Addendum)
Bridging    Slowly raise buttocks from floor, keeping stomach tight.  Hold 5 seconds Repeat _10___ times per set. Do 2____ sets per session. Do _1-2 ___ sessions per day.  http://orth.exer.us/1096   Copyright  VHI. All rights reserved.    [facility] [address] [place], [place] [address] Phone # [phone] Fax [phone]

## 2016-02-25 NOTE — Therapy (Signed)
[facility] [address] [place], [place], [address] Phone: [phone]   Fax:  [phone]  Physical Therapy Treatment  Patient Details  Name: Albert [name] MRN: [id number] Date of Birth: 01/13/1956 Referring Provider: Dr. Christopher Blackman  Encounter Date: 02/25/2016      PT End of Session - 02/25/16 1647    Visit Number 8   Date for PT Re-Evaluation 03/06/16   PT Start Time 1615   PT Stop Time 1655   PT Time Calculation (min) 40 min   Activity Tolerance Patient tolerated treatment well   Behavior During Therapy WFL for tasks assessed/performed      Past Medical History:  Diagnosis Date  . GERD (gastroesophageal reflux disease)     Past Surgical History:  Procedure Laterality Date  . INGUINAL HERNIA REPAIR    . INGUINAL HERNIA REPAIR Left 03/21/2015   Procedure: OPEN LEFT INGUINAL HERNIA REPAIR WITH MESH;  Surgeon: Douglas Blackman, MD;  Location: [facility];  Service: General;  Laterality: Left;  . INSERTION OF MESH Left 03/21/2015   Procedure: INSERTION OF MESH;  Surgeon: Douglas Blackman, MD;  Location: [facility];  Service: General;  Laterality: Left;  . KNEE SURGERY    . ROTATOR CUFF REPAIR      There were no vitals filed for this visit.      Subjective Assessment - 02/25/16 1614    Subjective No change in symptoms.     Patient Stated Goals reduce pain and get off Advil   Currently in Pain? Yes   Pain Score 0-No pain   Pain Location Hip   Pain Orientation Left   Pain Descriptors / Indicators Sore   Pain Type Chronic pain   Pain Onset More than a month ago   Pain Frequency Intermittent   Aggravating Factors  when not taking Advil   Pain Relieving Factors advil            [facility] PT Assessment - 02/25/16 0001      Assessment   Medical Diagnosis Left hip trochanteric bursitis     Cognition   Overall Cognitive Status Within Functional Limits for  tasks assessed     Observation/Other Assessments   Focus on Therapeutic Outcomes (FOTO)  42% limitation                     [facility] Adult PT Treatment/Exercise - 02/25/16 0001      Lumbar Exercises: Stretches   Active Hamstring Stretch 3 reps;20 seconds   Single Knee to Chest Stretch 3 reps;20 seconds   Piriformis Stretch 3 reps;20 seconds     Lumbar Exercises: Supine   Bridge 20 reps;5 seconds     Modalities   Modalities Electrical Stimulation     Moist Heat Therapy   Number Minutes Moist Heat 10 Minutes   Moist Heat Location Hip     Electrical Stimulation   Electrical Stimulation Location Lt gluteals   Electrical Stimulation Action IFC   Electrical Stimulation Parameters 10 minutes   Electrical Stimulation Goals Pain                PT Education - 02/25/16 1631    Education provided Yes   Education Details bridge, verbal review of all HEP   Person(s) Educated Patient   Methods Explanation;Demonstration;Handout   Comprehension Verbalized understanding          PT Short Term Goals - 02/18/16 1621        PT SHORT TERM GOAL #1   Title independent with initial HEP   Status Achieved     PT SHORT TERM GOAL #2   Title pain with laying on left hip decreased >/= 25%   Time 4   Period Weeks   Status On-going           PT Long Term Goals - 02/25/16 1619      PT LONG TERM GOAL #1   Title lay on left side with pain decreased >/= 75% using pillows   Status Not Met     PT LONG TERM GOAL #2   Title take 1 or less Advil per day   Status Not Met     PT LONG TERM GOAL #3   Title independent with HEP    Status Achieved               Plan - 02/25/16 1632    Clinical Impression Statement Pt reports no change in symptoms since the start of care.  PT advised D/C to HEP and follow-up with MD to discuss.  Pt has comprehensive HEP in place for hip flexiblity and strength.     PT Next Visit Plan D/C PT to HEP today   Consulted and Agree with  Plan of Care Patient      Patient will benefit from skilled therapeutic intervention in order to improve the following deficits and impairments:     Visit Diagnosis: Pain in left hip  Other muscle spasm  Muscle weakness (generalized)     Problem List Patient Active Problem List   Diagnosis Date Noted  . ESOPHAGEAL REFLUX 08/02/2008  . PERSONAL HISTORY OF COLONIC POLYPS 08/02/2008   PHYSICAL THERAPY DISCHARGE SUMMARY  Visits from Start of Care:8  Current functional level related to goals / functional outcomes: See above.  No significant change in status since the start of care.     Remaining deficits: Continued Lt gluteal and hip pain.  Pt with consistent need to take Advil.   Education / Equipment: HEP Plan: Patient agrees to discharge.  Patient goals were not met. Patient is being discharged due to lack of progress.  ?????         Kelly Takacs, PT 02/25/16 4:50 PM  [facility] [address] [place], [place], [address] Phone: [phone]   Fax:  [phone]  Name: Jumar E Dejager MRN: [id number] Date of Birth: 06/07/1955    

## 2016-02-29 ENCOUNTER — Other Ambulatory Visit: Payer: Self-pay | Admitting: Endocrinology

## 2016-02-29 DIAGNOSIS — E213 Hyperparathyroidism, unspecified: Secondary | ICD-10-CM

## 2016-03-03 ENCOUNTER — Ambulatory Visit: Payer: Self-pay

## 2016-03-12 ENCOUNTER — Ambulatory Visit
Admission: RE | Admit: 2016-03-12 | Discharge: 2016-03-12 | Disposition: A | Discharge status: Home / Self Care | Payer: 59 | Source: Ambulatory Visit | Attending: Endocrinology | Admitting: Endocrinology

## 2016-03-12 DIAGNOSIS — E213 Hyperparathyroidism, unspecified: Secondary | ICD-10-CM

## 2016-03-12 DIAGNOSIS — M8589 Other specified disorders of bone density and structure, multiple sites: Secondary | ICD-10-CM | POA: Diagnosis not present

## 2016-05-07 DIAGNOSIS — K611 Rectal abscess: Secondary | ICD-10-CM | POA: Diagnosis not present

## 2016-05-07 DIAGNOSIS — L0231 Cutaneous abscess of buttock: Secondary | ICD-10-CM | POA: Diagnosis not present

## 2016-05-07 MED FILL — AMOX TR-K CLV 875-125 MG TA: 875-125 | 5 days supply | Qty: 10 | Fill #0

## 2016-05-08 ENCOUNTER — Ambulatory Visit (INDEPENDENT_AMBULATORY_CARE_PROVIDER_SITE_OTHER): Payer: 59 | Admitting: Physician Assistant

## 2016-06-09 DIAGNOSIS — G8929 Other chronic pain: Secondary | ICD-10-CM | POA: Diagnosis not present

## 2016-06-09 DIAGNOSIS — R1032 Left lower quadrant pain: Secondary | ICD-10-CM | POA: Diagnosis not present

## 2016-07-15 ENCOUNTER — Encounter: Payer: Self-pay | Admitting: Physician Assistant

## 2016-07-15 ENCOUNTER — Ambulatory Visit (INDEPENDENT_AMBULATORY_CARE_PROVIDER_SITE_OTHER): Payer: 59 | Admitting: Physician Assistant

## 2016-07-15 VITALS — BP 122/70 | HR 68 | Ht 70.0 in | Wt 160.0 lb

## 2016-07-15 DIAGNOSIS — K219 Gastro-esophageal reflux disease without esophagitis: Secondary | ICD-10-CM | POA: Diagnosis not present

## 2016-07-15 DIAGNOSIS — R194 Change in bowel habit: Secondary | ICD-10-CM

## 2016-07-15 DIAGNOSIS — K611 Rectal abscess: Secondary | ICD-10-CM | POA: Diagnosis not present

## 2016-07-15 DIAGNOSIS — K625 Hemorrhage of anus and rectum: Secondary | ICD-10-CM | POA: Diagnosis not present

## 2016-07-15 MED ORDER — NA SULFATE-K SULFATE-MG SULF 17.5-3.13-1.6 GM/177ML PO SOLN: 1.0000 | ORAL | 0 refills | Status: DC

## 2016-07-15 MED ORDER — ESOMEPRAZOLE MAGNESIUM 40 MG PO CPDR: 40.0000 mg | DELAYED_RELEASE_CAPSULE | Freq: Every day | ORAL | 2 refills | Status: DC

## 2016-07-15 MED FILL — ESOMEPRAZOLE MAG DR 40 MG C: 40 | 30 days supply | Qty: 30 | Fill #0

## 2016-07-15 MED FILL — SUPREP BOWEL PREP KIT: 17.5-3.13-1 | 2 days supply | Qty: 354 | Fill #0

## 2016-07-15 NOTE — Progress Notes (Addendum)
Chief Complaint: Perirectal abscess, Change in bowel habits, GERD  HPI:  [name] is a 61 year old Caucasian male  who was referred to me by Albert Keens, MD for a complaint of continued perirectal abscess, change in bowel habits and GERD .     Per chart review the patient was previously followed in our clinic by Dr. Deatra [name]. His last colonoscopy was in 2009 with the finding of 2 polyps which were noted to be tubular adenomas.   Per review of referring physician's notes patient was seen at [facility] surgery on 07/11/16 for his perirectal abscess. It was noted that he had chronic left groin pain as well as recurrent perirectal abscesses. At that time he continued with tenderness in the area of his perirectal abscess and due to his constipation and ongoing recurrent perirectal abscesses it was recommend that he have a colonoscopy to see if there is a source for the abscess from a draining anal standpoint or if there are any other colorectal issues.   Today, the patient describes that he developed his first perirectal abscess at the beginning of 2016 after having surgery on his inguinal hernia in December 2015, he is unsure if this is related. He went to his family doctor and received antibiotics and pain meds and this went away within the next month or so. He did well for about a year and a half but at the beginning of 2017 he had recurrence of his perirectal abscess which has been waxing and waning since then with the help of antibiotics. He tells me it has always "busted" right before he goes to see the surgeons (which has been 3 times, from his history), so it is never been surgically drained. He has been off and on antibiotics over this time. Over the past 3-4 months this has been somewhat worse with a constant discomfort in this area, which is on the "top of my right cheek just above my anal opening".     Patient also notes occasional bright red blood with bowel movements which he  believes is just from "irritation".     He also tells me over the past 3-4 months he has had more "irregular" stools. He tells me that it feels like things are "not moving through". He notes that this has given him a feeling of some nausea and his reflux has gotten worse.   He has been on Nexium 40 mg for years for his reflux and tells me that this is "the only thing that helps". Apparently the patient has been tried on omeprazole as well as pantoprazole and generic Nexium (Esomeprazole), but none of these have controlled his symptoms. The only thing that works is Nexium, name brand. He ran out of this about a month ago and his insurance did not approve a refill. He has been having some reflux problems since.   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   Patient denies fever, chills, weight loss, fatigue, anorexia, vomiting or abdominal pain.  Past Medical History:  Diagnosis Date  . GERD (gastroesophageal reflux disease)   . Perirectal abscess     Past Surgical History:  Procedure Laterality Date  . INGUINAL HERNIA REPAIR    . INGUINAL HERNIA REPAIR Left 03/21/2015   Procedure: OPEN LEFT INGUINAL HERNIA REPAIR WITH MESH;  Surgeon: Albert Keens, MD;  Location: [place];  Service: General;  Laterality: Left;  . INSERTION OF MESH  Left 03/21/2015   Procedure: INSERTION OF MESH;  Surgeon: Albert Keens, MD;  Location: [place];  Service: General;  Laterality: Left;  . KNEE SURGERY    . ROTATOR CUFF REPAIR      Current Outpatient Prescriptions  Medication Sig Dispense Refill  . ibuprofen (ADVIL,MOTRIN) 200 MG tablet Take 200 mg by mouth every 6 (six) hours as needed.     No current facility-administered medications for this visit.     Allergies as of 07/15/2016  . (No Known Allergies)    Family History  Problem Relation Age of Onset  . Esophageal cancer Brother     Social  History   Social History  . Marital status: Married    Spouse name: N/A  . Number of children: N/A  . Years of education: N/A   Occupational History  . [employer] Employee    Social History Main Topics  . Smoking status: Light Tobacco Smoker    Types: Cigarettes  . Smokeless tobacco: Never Used  . Alcohol use No  . Drug use: No  . Sexual activity: Not on file   Other Topics Concern  . Not on file   Social History Narrative  . No narrative on file    Review of Systems:    Constitutional: No weight loss, fever or chills Skin: No rash  Cardiovascular: No chest pain Respiratory: No SOB  Gastrointestinal: See HPI and otherwise negative Genitourinary: No dysuria  Neurological: No headache Musculoskeletal: No new muscle or joint pain Hematologic: No bruising Psychiatric: No history of depression or anxiety   Physical Exam:  Vital signs: BP 122/70   Pulse 68   Ht 5\' 10"  (1.778 m)   Wt 160 lb (72.6 kg)   SpO2 98%   BMI 22.96 kg/m   Constitutional:   Pleasant Caucasian male appears to be in NAD, Well developed, Well nourished, alert and cooperative Head:  Normocephalic and atraumatic. Eyes:   PEERL, EOMI. No icterus. Conjunctiva pink. Ears:  Normal auditory acuity. Neck:  Supple Throat: Oral cavity and pharynx without inflammation, swelling or lesion.  Respiratory: Respirations even and unlabored. Lungs clear to auscultation bilaterally.   No wheezes, crackles, or rhonchi.  Cardiovascular: Normal S1, S2. No MRG. Regular rate and rhythm. No peripheral edema, cyanosis or pallor.  Gastrointestinal:  Soft, nondistended, mild generalized discomfort. No rebound or guarding. Normal bowel sounds. No appreciable masses or hepatomegaly. Rectal:  Not performed.  Msk:  Symmetrical without gross deformities. Without edema, no deformity or joint abnormality.  Neurologic:  Alert and  oriented x4;  grossly normal neurologically.  Skin:   Dry and intact without significant lesions  or rashes. Psychiatric: Demonstrates good judgement and reason without abnormal affect or behaviors.  No recent labs or imaging.  Assessment: 1. Recurrent perirectal abscesses: This has been recuring over the past 2 years, question from surgery whether this could be a fistulous drainage versus other 2. Change in bowel habits: Towards constipation over the past 3-4 months 3. GERD: Uncontrolled currently, patient has tried Pantoprazole, generic Nexium (Esomeprazole), and omeprazole in the past which have not helped his symptoms. Nexium 40 mg is the only thing that helps and he has been off of this over the past month due to trouble getting a refill 4. Rectal Bleeding: Intermittent, small amts on toilet paper, consider relation to abscess vs hemorrhoids vs other  Plan: 1. Scheduled patient for colonoscopy in the [facility] with Dr. Hilarie Fredrickson. Did discuss risks, benefits, limitations and alternatives and the  patient agrees to proceed. 2. Patient was re-prescribed Nexium 40 mg daily, 30-60 minutes before breakfast #30 with 5 refills 3. Patient was encouraged to start MiraLAX twice daily for his constipation. We did discuss how this can be titrated. 4. Patient to return to clinic per recommendations from Dr. Hilarie Fredrickson after time of procedure.  Ellouise Newer, PA-C [facility] 07/15/2016, 10:09 AM  Cc: Albert Keens, MD   Addendum: Reviewed and agree with initial management. Given recurrent perirectal abscesses and question of fistula, MRI pelvis with contrast is quite sensitive to pick up subtle perianal fistulae which can be difficult to see colonoscopy.  Agree with colonoscopy would also recommend MRI pelvis with contrast, rule out perianal fistula Jerene Bears, MD

## 2016-07-15 NOTE — Patient Instructions (Signed)
We have sent the following medications to your pharmacy for you to pick up at your convenience: Nexium 40 mg daily 30-60 mins before breakfast  miralax twice a day.  You have been scheduled for a colonoscopy. Please follow written instructions given to you at your visit today.  Please pick up your prep supplies at the pharmacy within the next 1-3 days. If you use inhalers (even only as needed), please bring them with you on the day of your procedure. Your physician has requested that you go to www.startemmi.com and enter the access code given to you at your visit today. This web site gives a general overview about your procedure. However, you should still follow specific instructions given to you by our office regarding your preparation for the procedure.

## 2016-07-17 ENCOUNTER — Telehealth: Payer: Self-pay | Admitting: Physician Assistant

## 2016-07-17 ENCOUNTER — Other Ambulatory Visit: Payer: Self-pay

## 2016-07-17 DIAGNOSIS — K611 Rectal abscess: Secondary | ICD-10-CM

## 2016-07-17 NOTE — Progress Notes (Signed)
The patient has been notified of this information and all questions answered.

## 2016-07-17 NOTE — Progress Notes (Signed)
You have been scheduled for an MRI at [facility] on 07/24/16 . Your appointment time is 5 pm. Please arrive 15 minutes prior to your appointment time for registration purposes. Please make certain not to have anything to eat or drink 6 hours prior to your test. In addition, if you have any metal in your body, have a pacemaker or defibrillator, please be sure to let your ordering physician know. This test typically takes 45 minutes to 1 hour to complete. Should you need to reschedule, please call [phone] to do so.

## 2016-07-17 NOTE — Telephone Encounter (Signed)
The pt was given the phone number to [facility] radiology so he can call and reschedule his appt for a time that works best for him.

## 2016-07-17 NOTE — Addendum Note (Signed)
Addended by: Jeoffrey Massed on: 07/17/2016 02:00 PM   Modules accepted: Orders

## 2016-07-22 ENCOUNTER — Ambulatory Visit (HOSPITAL_COMMUNITY)
Admission: RE | Admit: 2016-07-22 | Discharge: 2016-07-22 | Disposition: A | Discharge status: Home / Self Care | Payer: 59 | Source: Ambulatory Visit | Attending: Physician Assistant | Admitting: Physician Assistant

## 2016-07-22 DIAGNOSIS — K611 Rectal abscess: Secondary | ICD-10-CM | POA: Insufficient documentation

## 2016-07-22 MED ORDER — GADOBENATE DIMEGLUMINE 529 MG/ML IV SOLN
15.0000 mL | Freq: Once | INTRAVENOUS | Status: AC | PRN
  Administered 2016-07-22: 15 mL via INTRAVENOUS

## 2016-07-24 ENCOUNTER — Ambulatory Visit (HOSPITAL_COMMUNITY): Payer: 59

## 2016-07-28 ENCOUNTER — Encounter: Payer: Self-pay | Admitting: Internal Medicine

## 2016-08-11 ENCOUNTER — Ambulatory Visit (AMBULATORY_SURGERY_CENTER): Payer: 59 | Admitting: Internal Medicine

## 2016-08-11 ENCOUNTER — Encounter: Payer: Self-pay | Admitting: Internal Medicine

## 2016-08-11 VITALS — BP 110/70 | HR 62 | Temp 98.9°F | Resp 13 | Ht 70.0 in | Wt 160.0 lb

## 2016-08-11 DIAGNOSIS — D124 Benign neoplasm of descending colon: Secondary | ICD-10-CM | POA: Diagnosis not present

## 2016-08-11 DIAGNOSIS — K611 Rectal abscess: Secondary | ICD-10-CM

## 2016-08-11 DIAGNOSIS — D123 Benign neoplasm of transverse colon: Secondary | ICD-10-CM

## 2016-08-11 DIAGNOSIS — Z86010 Personal history of colonic polyps: Secondary | ICD-10-CM | POA: Diagnosis present

## 2016-08-11 DIAGNOSIS — D125 Benign neoplasm of sigmoid colon: Secondary | ICD-10-CM

## 2016-08-11 DIAGNOSIS — K219 Gastro-esophageal reflux disease without esophagitis: Secondary | ICD-10-CM | POA: Diagnosis not present

## 2016-08-11 MED ORDER — SODIUM CHLORIDE 0.9 % IV SOLN: 500.0000 mL | INTRAVENOUS | Status: DC

## 2016-08-11 NOTE — Patient Instructions (Signed)
Handouts:  Polyps and Hemorrhoids  YOU HAD AN ENDOSCOPIC PROCEDURE TODAY AT THE [facility]:   Refer to the procedure report that was given to you for any specific questions about what was found during the examination.  If the procedure report does not answer your questions, please call your gastroenterologist to clarify.  If you requested that your care partner not be given the details of your procedure findings, then the procedure report has been included in a sealed envelope for you to review at your convenience later.  YOU SHOULD EXPECT: Some feelings of bloating in the abdomen. Passage of more gas than usual.  Walking can help get rid of the air that was put into your GI tract during the procedure and reduce the bloating. If you had a lower endoscopy (such as a colonoscopy or flexible sigmoidoscopy) you may notice spotting of blood in your stool or on the toilet paper. If you underwent a bowel prep for your procedure, you may not have a normal bowel movement for a few days.  Please Note:  You might notice some irritation and congestion in your nose or some drainage.  This is from the oxygen used during your procedure.  There is no need for concern and it should clear up in a day or so.  SYMPTOMS TO REPORT IMMEDIATELY:   Following lower endoscopy (colonoscopy or flexible sigmoidoscopy):  Excessive amounts of blood in the stool  Significant tenderness or worsening of abdominal pains  Swelling of the abdomen that is new, acute  Fever of 100F or higher  For urgent or emergent issues, a gastroenterologist can be reached at any hour by calling [phone].   DIET:  We do recommend a small meal at first, but then you may proceed to your regular diet.  Drink plenty of fluids but you should avoid alcoholic beverages for 24 hours.  ACTIVITY:  You should plan to take it easy for the rest of today and you should NOT DRIVE or use heavy machinery until tomorrow (because of the sedation  medicines used during the test).    FOLLOW UP: Our staff will call the number listed on your records the next business day following your procedure to check on you and address any questions or concerns that you may have regarding the information given to you following your procedure. If we do not reach you, we will leave a message.  However, if you are feeling well and you are not experiencing any problems, there is no need to return our call.  We will assume that you have returned to your regular daily activities without incident.  If any biopsies were taken you will be contacted by phone or by letter within the next 1-3 weeks.  Please call us at [phone] if you have not heard about the biopsies in 3 weeks.    SIGNATURES/CONFIDENTIALITY: You and/or your care partner have signed paperwork which will be entered into your electronic medical record.  These signatures attest to the fact that that the information above on your After Visit Summary has been reviewed and is understood.  Full responsibility of the confidentiality of this discharge information lies with you and/or your care-partner. 

## 2016-08-11 NOTE — Progress Notes (Signed)
Called to room to assist during endoscopic procedure.  Patient ID and intended procedure confirmed with present staff. Received instructions for my participation in the procedure from the performing physician.  

## 2016-08-11 NOTE — Op Note (Signed)
[facility] Patient Name: Saahas [name] Procedure Date: 08/11/2016 3:25 PM MRN: [id number] Endoscopist: Jerene Bears , MD Age: 61 Referring MD:  Date of Birth: 1956-02-23 Gender: Male Account #: [id number] Procedure:                Colonoscopy Indications:              Surveillance: Personal history of adenomatous                            polyps on last colonoscopy > 5 years ago, Last                            colonoscopy: 2009, history of recurrent perirectal                            abscesses Medicines:                Monitored Anesthesia Care Procedure:                Pre-Anesthesia Assessment:                           - Prior to the procedure, a History and Physical                            was performed, and patient medications and                            allergies were reviewed. The patient's tolerance of                            previous anesthesia was also reviewed. The risks                            and benefits of the procedure and the sedation                            options and risks were discussed with the patient.                            All questions were answered, and informed consent                            was obtained. Prior Anticoagulants: The patient has                            taken no previous anticoagulant or antiplatelet                            agents. ASA Grade Assessment: II - A patient with                            mild systemic disease. After reviewing the risks  and benefits, the patient was deemed in                            satisfactory condition to undergo the procedure.                           After obtaining informed consent, the colonoscope                            was passed under direct vision. Throughout the                            procedure, the patient's blood pressure, pulse, and                            oxygen saturations were monitored continuously. The                     Colonoscope was introduced through the anus and                            advanced to the the terminal ileum. The colonoscopy                            was performed without difficulty. The patient                            tolerated the procedure well. The quality of the                            bowel preparation was good. The terminal ileum,                            ileocecal valve, appendiceal orifice, and rectum                            were photographed. Scope In: 3:36:12 PM Scope Out: 3:58:31 PM Scope Withdrawal Time: 0 hours 20 minutes 24 seconds  Total Procedure Duration: 0 hours 22 minutes 19 seconds  Findings:                 The digital rectal exam was normal.                           The terminal ileum appeared normal.                           A 6 mm polyp was found in the distal transverse                            colon. The polyp was sessile. The polyp was removed                            with a cold snare. Resection and retrieval were  complete.                           A 5 mm polyp was found in the descending colon. The                            polyp was sessile. The polyp was removed with a                            cold snare. Resection and retrieval were complete.                           A 5 mm polyp was found in the sigmoid colon. The                            polyp was sessile. The polyp was removed with a                            cold snare. Resection and retrieval were complete.                           Internal hemorrhoids were found during                            retroflexion. The hemorrhoids were medium-sized.                           There is no endoscopic evidence of colitis or                            fistulas in the entire colon. Complications:            No immediate complications. Estimated Blood Loss:     Estimated blood loss was minimal. Impression:               - The examined  portion of the ileum was normal.                           - One 6 mm polyp in the distal transverse colon,                            removed with a cold snare. Resected and retrieved.                           - One 5 mm polyp in the descending colon, removed                            with a cold snare. Resected and retrieved.                           - One 5 mm polyp in the sigmoid colon, removed with  a cold snare. Resected and retrieved.                           - Internal hemorrhoids. Recommendation:           - Patient has a contact number available for                            emergencies. The signs and symptoms of potential                            delayed complications were discussed with the                            patient. Return to normal activities tomorrow.                            Written discharge instructions were provided to the                            patient.                           - Resume previous diet.                           - Continue present medications.                           - Await pathology results.                           - Repeat colonoscopy is recommended for                            surveillance. The colonoscopy date will be                            determined after pathology results from today's                            exam become available for review. Jerene Bears, MD 08/11/2016 4:08:35 PM This report has been signed electronically. CC Letter to:             Coralie Keens, MD

## 2016-08-11 NOTE — Progress Notes (Signed)
Pt's states no medical or surgical changes since previsit or office visit. 

## 2016-08-11 NOTE — Progress Notes (Signed)
To recovery, report to Tyrell, RN, VSS. 

## 2016-08-12 ENCOUNTER — Telehealth: Payer: Self-pay

## 2016-08-12 NOTE — Telephone Encounter (Signed)
  Follow up Call-  Call back number 08/11/2016  Post procedure Call Back phone  # [phone]  Permission to leave phone message Yes  Some recent data might be hidden     Patient questions:  Do you have a fever, pain , or abdominal swelling? No. Pain Score  0 *  Have you tolerated food without any problems? Yes.    Have you been able to return to your normal activities? Yes.    Do you have any questions about your discharge instructions: Diet   No. Medications  No. Follow up visit  No.  Do you have questions or concerns about your Care? No.  Actions: * If pain score is 4 or above: No action needed, pain <4.  No problems noted per pt. maw

## 2016-08-19 ENCOUNTER — Encounter: Payer: Self-pay | Admitting: Internal Medicine

## 2016-08-26 DIAGNOSIS — R7301 Impaired fasting glucose: Secondary | ICD-10-CM | POA: Diagnosis not present

## 2016-08-26 DIAGNOSIS — E782 Mixed hyperlipidemia: Secondary | ICD-10-CM | POA: Diagnosis not present

## 2016-08-29 DIAGNOSIS — G8929 Other chronic pain: Secondary | ICD-10-CM | POA: Diagnosis not present

## 2016-08-29 DIAGNOSIS — R1032 Left lower quadrant pain: Secondary | ICD-10-CM | POA: Diagnosis not present

## 2016-09-01 DIAGNOSIS — M545 Low back pain: Secondary | ICD-10-CM | POA: Diagnosis not present

## 2016-09-01 DIAGNOSIS — K5730 Diverticulosis of large intestine without perforation or abscess without bleeding: Secondary | ICD-10-CM | POA: Diagnosis not present

## 2016-09-01 DIAGNOSIS — G8929 Other chronic pain: Secondary | ICD-10-CM | POA: Diagnosis not present

## 2016-09-01 DIAGNOSIS — K219 Gastro-esophageal reflux disease without esophagitis: Secondary | ICD-10-CM | POA: Diagnosis not present

## 2016-09-01 MED FILL — CYCLOBENZAPRINE 5 MG TABLET: 5 | 20 days supply | Qty: 60 | Fill #0

## 2016-09-01 MED FILL — ESOMEPRAZOLE MAG DR 40 MG C: 40 | 90 days supply | Qty: 90 | Fill #0

## 2016-09-01 MED FILL — MELOXICAM 15 MG TABLET: 15 | 90 days supply | Qty: 90 | Fill #0

## 2016-09-04 DIAGNOSIS — K611 Rectal abscess: Secondary | ICD-10-CM | POA: Diagnosis not present

## 2016-09-04 MED FILL — AMOX TR-K CLV 875-125 MG TA: 875-125 | 5 days supply | Qty: 10 | Fill #0

## 2016-09-23 ENCOUNTER — Other Ambulatory Visit: Payer: Self-pay | Admitting: Surgery

## 2016-09-23 DIAGNOSIS — K603 Anal fistula: Secondary | ICD-10-CM | POA: Diagnosis not present

## 2016-10-22 ENCOUNTER — Encounter (HOSPITAL_BASED_OUTPATIENT_CLINIC_OR_DEPARTMENT_OTHER): Payer: Self-pay | Admitting: *Deleted

## 2016-10-26 NOTE — H&P (Signed)
  Albert [name]  Location: [facility] Patient #: [id number] DOB: 1955-04-16 Married / Language: English / Race: White Male   History of Present Illness Patient words: f/u gluteal abscess.  The patient is a 61 year old male who presents with a complaint of Fistula. He is here for another evaluation of his recurrent perirectal abscesses. He has had another incision and drainage. He had an MRI showing a lot of inflammation or fistula cannot be seen. He is again having leakage and some purulence from a small opening in the perianal area. He is otherwise without complaints. Again, he has no history of malignancy or inflammatory bowel disease  Other Problems  Back Pain  Gastroesophageal Reflux Disease  Inguinal Hernia  Kidney Stone   Past Surgical History  Knee Surgery  Right. Laparoscopic Inguinal Hernia Surgery  Left.  Diagnostic Studies Colonoscopy  5-10 years ago  Allergies  No Known Drug Allergies   Medication History  Esomeprazole Magnesium (40MG  Capsule DR, Oral) Active. Meloxicam (7.5MG  Tablet, Oral) Active. Medications Reconciled  Vitals  Weight: 162.8 lb Height: 70in Body Surface Area: 1.91 m Body Mass Index: 23.36 kg/m  Temp.: 98.39F(Oral)  Pulse: 80 (Regular)  BP: 112/70 (Sitting, Left Arm, Standard)     Physical Exam  The physical exam findings are as follows: Generally well in appearance Lungs CTA bilaterally CV RRR Abdomen soft, NT Skin without rashes Note:On rectal examination, he has a small opening in the posterior midline that is draining a small amount of purulence. There is induration. It is consistent with a fistula    Assessment & Plan  PERIANAL FISTULA (K60.3)  Impression: I do believe he has a perianal fistula. I'm recommending examination under anesthesia and fistulotomy. He is eager to proceed with surgery. I discussed the risks of surgery which includes but is not limited to bleeding,  infection, incontinence, injury to surrounding structures, having a chronic open wound, recurrence, etc. We also discussed postoperative recovery. Surgery will be scheduled

## 2016-10-27 ENCOUNTER — Encounter (HOSPITAL_BASED_OUTPATIENT_CLINIC_OR_DEPARTMENT_OTHER): Payer: Self-pay

## 2016-10-27 ENCOUNTER — Ambulatory Visit (HOSPITAL_BASED_OUTPATIENT_CLINIC_OR_DEPARTMENT_OTHER): Payer: 59 | Admitting: Certified Registered"

## 2016-10-27 ENCOUNTER — Encounter (HOSPITAL_BASED_OUTPATIENT_CLINIC_OR_DEPARTMENT_OTHER)
Admission: RE | Disposition: A | Discharge status: Home / Self Care | Payer: Self-pay | Source: Ambulatory Visit | Attending: Surgery

## 2016-10-27 ENCOUNTER — Ambulatory Visit (HOSPITAL_BASED_OUTPATIENT_CLINIC_OR_DEPARTMENT_OTHER)
Admission: RE | Admit: 2016-10-27 | Discharge: 2016-10-27 | Disposition: A | Discharge status: Home / Self Care | Payer: 59 | Source: Ambulatory Visit | Attending: Surgery | Admitting: Surgery

## 2016-10-27 DIAGNOSIS — Z79899 Other long term (current) drug therapy: Secondary | ICD-10-CM | POA: Diagnosis not present

## 2016-10-27 DIAGNOSIS — K219 Gastro-esophageal reflux disease without esophagitis: Secondary | ICD-10-CM | POA: Diagnosis not present

## 2016-10-27 DIAGNOSIS — Z86010 Personal history of colonic polyps: Secondary | ICD-10-CM | POA: Diagnosis not present

## 2016-10-27 DIAGNOSIS — K603 Anal fistula: Secondary | ICD-10-CM | POA: Insufficient documentation

## 2016-10-27 DIAGNOSIS — F17200 Nicotine dependence, unspecified, uncomplicated: Secondary | ICD-10-CM | POA: Insufficient documentation

## 2016-10-27 HISTORY — PX: FISTULOTOMY: SHX6413

## 2016-10-27 HISTORY — DX: Unspecified osteoarthritis, unspecified site: M19.90

## 2016-10-27 SURGERY — EXAM UNDER ANESTHESIA
Anesthesia: General

## 2016-10-27 MED ORDER — LIDOCAINE HCL (CARDIAC) 20 MG/ML IV SOLN
INTRAVENOUS | Status: DC | PRN
  Administered 2016-10-27: 60 mg via INTRAVENOUS

## 2016-10-27 MED ORDER — MIDAZOLAM HCL 2 MG/2ML IJ SOLN
1.0000 mg | INTRAMUSCULAR | Status: DC | PRN
  Administered 2016-10-27: 2 mg via INTRAVENOUS

## 2016-10-27 MED ORDER — ONDANSETRON HCL 4 MG/2ML IJ SOLN
INTRAMUSCULAR | Status: AC
  Filled 2016-10-27: qty 2

## 2016-10-27 MED ORDER — FENTANYL CITRATE (PF) 100 MCG/2ML IJ SOLN
INTRAMUSCULAR | Status: AC
  Filled 2016-10-27: qty 2

## 2016-10-27 MED ORDER — LIDOCAINE 5 % EX OINT: 1.0000 "application " | TOPICAL_OINTMENT | CUTANEOUS | 0 refills | Status: DC | PRN

## 2016-10-27 MED ORDER — FENTANYL CITRATE (PF) 100 MCG/2ML IJ SOLN
25.0000 ug | INTRAMUSCULAR | Status: DC | PRN
Start: 2016-10-27 — End: 2016-10-27

## 2016-10-27 MED ORDER — SODIUM CHLORIDE 0.9% FLUSH: 3.0000 mL | INTRAVENOUS | Status: DC | PRN

## 2016-10-27 MED ORDER — OXYCODONE HCL 5 MG PO TABS: 5.0000 mg | ORAL_TABLET | Freq: Four times a day (QID) | ORAL | 0 refills | Status: DC | PRN

## 2016-10-27 MED ORDER — BUPIVACAINE-EPINEPHRINE (PF) 0.5% -1:200000 IJ SOLN
INTRAMUSCULAR | Status: AC
  Filled 2016-10-27: qty 30

## 2016-10-27 MED ORDER — PROPOFOL 10 MG/ML IV BOLUS
INTRAVENOUS | Status: DC | PRN
  Administered 2016-10-27: 200 mg via INTRAVENOUS

## 2016-10-27 MED ORDER — LACTATED RINGERS IV SOLN: INTRAVENOUS | Status: DC

## 2016-10-27 MED ORDER — CHLORHEXIDINE GLUCONATE CLOTH 2 % EX PADS: 6.0000 | MEDICATED_PAD | Freq: Once | CUTANEOUS | Status: DC

## 2016-10-27 MED ORDER — LIDOCAINE 2% (20 MG/ML) 5 ML SYRINGE
INTRAMUSCULAR | Status: AC
  Filled 2016-10-27: qty 5

## 2016-10-27 MED ORDER — CEFAZOLIN SODIUM-DEXTROSE 2-4 GM/100ML-% IV SOLN
2.0000 g | INTRAVENOUS | Status: AC
  Administered 2016-10-27: 2 g via INTRAVENOUS

## 2016-10-27 MED ORDER — ACETAMINOPHEN 650 MG RE SUPP: 650.0000 mg | RECTAL | Status: DC | PRN

## 2016-10-27 MED ORDER — BUPIVACAINE LIPOSOME 1.3 % IJ SUSP
INTRAMUSCULAR | Status: AC
  Filled 2016-10-27: qty 20

## 2016-10-27 MED ORDER — DEXAMETHASONE SODIUM PHOSPHATE 4 MG/ML IJ SOLN
INTRAMUSCULAR | Status: DC | PRN
  Administered 2016-10-27: 10 mg via INTRAVENOUS
  Administered 2016-10-27: 4 mg via INTRAVENOUS

## 2016-10-27 MED ORDER — PROPOFOL 500 MG/50ML IV EMUL
INTRAVENOUS | Status: AC
  Filled 2016-10-27: qty 50

## 2016-10-27 MED ORDER — BUPIVACAINE LIPOSOME 1.3 % IJ SUSP
INTRAMUSCULAR | Status: DC | PRN
  Administered 2016-10-27: 20 mL

## 2016-10-27 MED ORDER — CEFAZOLIN SODIUM-DEXTROSE 2-4 GM/100ML-% IV SOLN
INTRAVENOUS | Status: AC
  Filled 2016-10-27: qty 100

## 2016-10-27 MED ORDER — ACETAMINOPHEN 325 MG PO TABS: 650.0000 mg | ORAL_TABLET | ORAL | Status: DC | PRN

## 2016-10-27 MED ORDER — METOCLOPRAMIDE HCL 5 MG/ML IJ SOLN: 10.0000 mg | Freq: Once | INTRAMUSCULAR | Status: DC | PRN

## 2016-10-27 MED ORDER — KETOROLAC TROMETHAMINE 30 MG/ML IJ SOLN
INTRAMUSCULAR | Status: DC | PRN
  Administered 2016-10-27: 30 mg via INTRAVENOUS

## 2016-10-27 MED ORDER — LACTATED RINGERS IV SOLN
INTRAVENOUS | Status: DC
Start: 2016-10-27 — End: 2016-10-27
  Administered 2016-10-27: 07:00:00 via INTRAVENOUS

## 2016-10-27 MED ORDER — SODIUM CHLORIDE 0.9% FLUSH: 3.0000 mL | Freq: Two times a day (BID) | INTRAVENOUS | Status: DC

## 2016-10-27 MED ORDER — DEXAMETHASONE SODIUM PHOSPHATE 10 MG/ML IJ SOLN
INTRAMUSCULAR | Status: AC
  Filled 2016-10-27: qty 1

## 2016-10-27 MED ORDER — MORPHINE SULFATE (PF) 2 MG/ML IV SOLN: 1.0000 mg | INTRAVENOUS | Status: DC | PRN

## 2016-10-27 MED ORDER — OXYCODONE HCL 5 MG PO TABS: 5.0000 mg | ORAL_TABLET | ORAL | Status: DC | PRN

## 2016-10-27 MED ORDER — SCOPOLAMINE 1 MG/3DAYS TD PT72: 1.0000 | MEDICATED_PATCH | Freq: Once | TRANSDERMAL | Status: DC | PRN

## 2016-10-27 MED ORDER — SODIUM CHLORIDE 0.9 % IV SOLN: 250.0000 mL | INTRAVENOUS | Status: DC | PRN

## 2016-10-27 MED ORDER — FENTANYL CITRATE (PF) 100 MCG/2ML IJ SOLN
50.0000 ug | INTRAMUSCULAR | Status: DC | PRN
  Administered 2016-10-27 (×2): 50 ug via INTRAVENOUS

## 2016-10-27 MED ORDER — MEPERIDINE HCL 25 MG/ML IJ SOLN: 6.2500 mg | INTRAMUSCULAR | Status: DC | PRN

## 2016-10-27 MED ORDER — MIDAZOLAM HCL 2 MG/2ML IJ SOLN
INTRAMUSCULAR | Status: AC
  Filled 2016-10-27: qty 2

## 2016-10-27 MED FILL — oxyCODONE HCL 5 MG TABS: 5 | 4 days supply | Qty: 30 | Fill #0

## 2016-10-27 SURGICAL SUPPLY — 36 items
BLADE SURG 15 STRL LF DISP TIS (BLADE) ×1 IMPLANT
BLADE SURG 15 STRL SS (BLADE) ×2
BRIEF STRETCH FOR OB PAD LRG (UNDERPADS AND DIAPERS) ×3 IMPLANT
CANISTER SUCT 1200ML W/VALVE (MISCELLANEOUS) ×3 IMPLANT
COVER MAYO STAND STRL (DRAPES) ×3 IMPLANT
DECANTER SPIKE VIAL GLASS SM (MISCELLANEOUS) IMPLANT
DRAPE UTILITY XL STRL (DRAPES) ×3 IMPLANT
DRSG PAD ABDOMINAL 8X10 ST (GAUZE/BANDAGES/DRESSINGS) IMPLANT
ELECT REM PT RETURN 9FT ADLT (ELECTROSURGICAL) ×3
ELECTRODE REM PT RTRN 9FT ADLT (ELECTROSURGICAL) ×1 IMPLANT
GAUZE SPONGE 4X4 12PLY STRL LF (GAUZE/BANDAGES/DRESSINGS) ×3 IMPLANT
GLOVE BIOGEL PI IND STRL 7.0 (GLOVE) ×1 IMPLANT
GLOVE BIOGEL PI INDICATOR 7.0 (GLOVE) ×2
GLOVE SURG SIGNA 7.5 PF LTX (GLOVE) ×3 IMPLANT
GLOVE SURG SS PI 6.5 STRL IVOR (GLOVE) ×3 IMPLANT
GOWN STRL REUS W/ TWL LRG LVL3 (GOWN DISPOSABLE) ×1 IMPLANT
GOWN STRL REUS W/ TWL XL LVL3 (GOWN DISPOSABLE) ×1 IMPLANT
GOWN STRL REUS W/TWL LRG LVL3 (GOWN DISPOSABLE) ×2
GOWN STRL REUS W/TWL XL LVL3 (GOWN DISPOSABLE) ×2
NEEDLE HYPO 25X1 1.5 SAFETY (NEEDLE) ×3 IMPLANT
PACK BASIN DAY SURGERY FS (CUSTOM PROCEDURE TRAY) ×3 IMPLANT
PACK LITHOTOMY IV (CUSTOM PROCEDURE TRAY) ×3 IMPLANT
PENCIL BUTTON HOLSTER BLD 10FT (ELECTRODE) ×3 IMPLANT
SPONGE SURGIFOAM ABS GEL 100 (HEMOSTASIS) IMPLANT
SPONGE SURGIFOAM ABS GEL 12-7 (HEMOSTASIS) IMPLANT
SURGILUBE 2OZ TUBE FLIPTOP (MISCELLANEOUS) ×3 IMPLANT
SUT CHROMIC 3 0 SH 27 (SUTURE) IMPLANT
SYR CONTROL 10ML LL (SYRINGE) ×3 IMPLANT
TOWEL OR 17X24 6PK STRL BLUE (TOWEL DISPOSABLE) ×3 IMPLANT
TOWEL OR NON WOVEN STRL DISP B (DISPOSABLE) ×3 IMPLANT
TRAY DSU PREP LF (CUSTOM PROCEDURE TRAY) ×3 IMPLANT
TRAY PROCTOSCOPIC FIBER OPTIC (SET/KITS/TRAYS/PACK) IMPLANT
TUBE CONNECTING 20'X1/4 (TUBING) ×1
TUBE CONNECTING 20X1/4 (TUBING) ×2 IMPLANT
UNDERPAD 30X30 (UNDERPADS AND DIAPERS) ×3 IMPLANT
YANKAUER SUCT BULB TIP NO VENT (SUCTIONS) ×3 IMPLANT

## 2016-10-27 NOTE — Anesthesia Preprocedure Evaluation (Signed)
Anesthesia Evaluation  Patient identified by MRN, date of birth, ID band Patient awake    Reviewed: Allergy & Precautions, NPO status , Patient's Chart, lab work & pertinent test results  Airway Mallampati: II  TM Distance: >3 FB Neck ROM: Full    Dental no notable dental hx. (+) Caps   Pulmonary Current Smoker,    Pulmonary exam normal breath sounds clear to auscultation       Cardiovascular negative cardio ROS Normal cardiovascular exam Rhythm:Regular Rate:Normal     Neuro/Psych negative neurological ROS  negative psych ROS   GI/Hepatic Neg liver ROS, GERD  Medicated and Controlled,  Endo/Other  negative endocrine ROS  Renal/GU negative Renal ROS  negative genitourinary   Musculoskeletal negative musculoskeletal ROS (+)   Abdominal   Peds negative pediatric ROS (+)  Hematology negative hematology ROS (+)   Anesthesia Other Findings   Reproductive/Obstetrics negative OB ROS                             Anesthesia Physical Anesthesia Plan  ASA: II  Anesthesia Plan: General   Post-op Pain Management:    Induction: Intravenous  PONV Risk Score and Plan: 1 and Ondansetron and Dexamethasone  Airway Management Planned: Oral ETT  Additional Equipment:   Intra-op Plan:   Post-operative Plan: Extubation in OR  Informed Consent: I have reviewed the patients History and Physical, chart, labs and discussed the procedure including the risks, benefits and alternatives for the proposed anesthesia with the patient or authorized representative who has indicated his/her understanding and acceptance.   Dental advisory given  Plan Discussed with: CRNA  Anesthesia Plan Comments:         Anesthesia Quick Evaluation

## 2016-10-27 NOTE — Transfer of Care (Signed)
Immediate Anesthesia Transfer of Care Note  Patient: Albert [name]  Procedure(s) Performed: Procedure(s): EXAM UNDER ANESTHESIA (N/A) ANAL FISTULOTOMY (N/A)  Patient Location: PACU  Anesthesia Type:General  Level of Consciousness: awake and patient cooperative  Airway & Oxygen Therapy: Patient Spontanous Breathing and Patient connected to face mask oxygen  Post-op Assessment: Report given to RN and Post -op Vital signs reviewed and stable  Post vital signs: Reviewed and stable  Last Vitals:  Vitals:   10/27/16 0635  BP: 136/74  Pulse: (!) 56  Resp: 16  Temp: 36.5 C    Last Pain:  Vitals:   10/27/16 0637  TempSrc:   PainSc: 6       Patients Stated Pain Goal: 2 (14/23/95 3202)  Complications: No apparent anesthesia complications

## 2016-10-27 NOTE — Op Note (Signed)
EXAM UNDER ANESTHESIA, ANAL FISTULOTOMY  Procedure Note  Albert [name] 10/27/2016   Pre-op Diagnosis: PERIANAL FISTULA     Post-op Diagnosis: same  Procedure(s): EXAM UNDER ANESTHESIA ANAL FISTULOTOMY  Surgeon(s): Coralie Keens, MD  Anesthesia: General  Staff:  Circulator: Maurene Capes, RN Scrub Person: Harrel Lemon, RN  Estimated Blood Loss: Minimal               Indications: This is [name] 61 year old gentleman with [name] previous history of [name] perirectal abscess with recurrence. He has had [name] persistent small opening at the posterior midline consistent with [name] fistula. Decision was made to proceed with fistulotomy.  Procedure: The patient was brought to the operating room and identified as the correct patient. He was placed supine on the operating table and general anesthesia was induced. His perianal area was prepped and draped in the usual sterile fashion. I injected anesthetic around the anus. I then inserted [name] retractor into the anal canal and inspected circumferentially. No intra-anal pathology was identified. The fistula track completely healed the skin level I could see the scar and feel hard tissue that area. I opened up this area with [name] scalpel making [name] small incision. I then inserted [name] fistula probe and found [name] superficial tract going toward the posterior midline. I then opened up the tract with the electrocautery. This did not appear to cross the sphincter mechanism. I excised granulation tissue with the cautery and completely cauterized tract. I then achieved hemostasis with the cautery. I injected further anesthetic around the canal. I placed [name] small piece of Gelfoam into the small opening to aid with hemostasis which appeared to be achieved. The patient tolerated the procedure well. All the counts were correct at the end of the procedure. The patient was then extubated in the operating room and taken in [name] stable condition to the recovery room.           Albert [name]   Date: 10/27/2016  Time: 7:47 AM

## 2016-10-27 NOTE — Anesthesia Postprocedure Evaluation (Signed)
Anesthesia Post Note  Patient: Albert [name]  Procedure(s) Performed: Procedure(s) (LRB): EXAM UNDER ANESTHESIA (N/A) ANAL FISTULOTOMY (N/A)     Patient location during evaluation: PACU Anesthesia Type: General Level of consciousness: awake and alert Pain management: pain level controlled Vital Signs Assessment: post-procedure vital signs reviewed and stable Respiratory status: spontaneous breathing, nonlabored ventilation, respiratory function stable and patient connected to nasal cannula oxygen Cardiovascular status: blood pressure returned to baseline and stable Postop Assessment: no signs of nausea or vomiting Anesthetic complications: no    Last Vitals:  Vitals:   10/27/16 0815 10/27/16 0853  BP: 115/70 123/81  Pulse: 66 (!) 55  Resp: 17 18  Temp:  (!) 36.4 C    Last Pain:  Vitals:   10/27/16 0853  TempSrc:   PainSc: 0-No pain                 [name]

## 2016-10-27 NOTE — Anesthesia Procedure Notes (Signed)
Procedure Name: LMA Insertion Date/Time: 10/27/2016 7:35 AM Performed by: Orris Perin D Pre-anesthesia Checklist: Patient identified, Emergency Drugs available, Suction available and Patient being monitored Patient Re-evaluated:Patient Re-evaluated prior to induction Oxygen Delivery Method: Circle system utilized Preoxygenation: Pre-oxygenation with 100% oxygen Induction Type: IV induction Ventilation: Mask ventilation without difficulty LMA: LMA inserted LMA Size: 4.0 Number of attempts: 1 Airway Equipment and Method: Bite block Placement Confirmation: positive ETCO2 Tube secured with: Tape Dental Injury: Teeth and Oropharynx as per pre-operative assessment

## 2016-10-27 NOTE — Interval H&P Note (Signed)
History and Physical Interval Note: no change in H and P  10/27/2016 6:41 AM  Mardene Speak  has presented today for surgery, with the diagnosis of PERIANAL FISTULA  The various methods of treatment have been discussed with the patient and family. After consideration of risks, benefits and other options for treatment, the patient has consented to  Procedure(s): EXAM UNDER ANESTHESIA (N/A) ANAL FISTULOTOMY (N/A) as a surgical intervention .  The patient's history has been reviewed, patient examined, no change in status, stable for surgery.  I have reviewed the patient's chart and labs.  Questions were answered to the patient's satisfaction.     Davis Vannatter A

## 2016-10-27 NOTE — Discharge Instructions (Signed)
[facility] _______[facility]  RECTAL SURGERY POST OP INSTRUCTIONS: POST OP INSTRUCTIONS  Always review your discharge instruction sheet given to you by the facility where your surgery was performed. IF YOU HAVE DISABILITY OR FAMILY LEAVE FORMS, YOU MUST BRING THEM TO THE OFFICE FOR PROCESSING.   DO NOT GIVE THEM TO YOUR DOCTOR.  1. A  prescription for pain medication may be given to you upon discharge.  Take your pain medication as prescribed, if needed.  If narcotic pain medicine is not needed, then you may take acetaminophen (Tylenol) or ibuprofen (Advil) as needed. 2. Take your usually prescribed medications unless otherwise directed. 3. If you need a refill on your pain medication, please contact your pharmacy.  They will contact our office to request authorization. Prescriptions will not be filled after 5 pm or on week-ends. 4. You should follow a light diet the first 48 hours after arrival home, such as soup and crackers, etc.  Be sure to include lots of fluids daily.  Resume your normal diet 2-3 days after surgery.. 5. Most patients will experience some swelling and discomfort in the rectal area. Ice packs, reclining and warm tub soaks will help.  Swelling and discomfort can take several days to resolve.  6. It is common to experience some constipation if taking pain medication after surgery.  Increasing fluid intake and taking a stool softener (such as Colace) will usually help or prevent this problem from occurring.  A mild laxative (Milk of Magnesia or Miralax) should be taken according to package directions if there are no bowel movements after 48 hours. 7. Unless discharge instructions indicate otherwise, leave your bandage dry and in place for 24 hours, or remove the bandage if you have a bowel movement. You may notice a small amount of bleeding with bowel movements for the first few days. You may have some packing in the rectum which will come out over the first day or two. You  will need to wear an absorbent pad or soft cotton gauze in your underwear until the drainage stops.it. 8. ACTIVITIES:  You may resume regular (light) daily activities beginning the next day--such as daily self-care, walking, climbing stairs--gradually increasing activities as tolerated.  You may have sexual intercourse when it is comfortable.  Refrain from any heavy lifting or straining until approved by your doctor. a. You may drive when you are no longer taking prescription pain medication, you can comfortably wear a seatbelt, and you can safely maneuver your car and apply brakes. b. RETURN TO WORK: : __one week__________________ c.  9. You should see your doctor in the office for a follow-up appointment approximately 2-3 weeks after your surgery.  Make sure that you call for this appointment within a day or two after you arrive home to insure a convenient appointment time. 10. OTHER INSTRUCTIONS: SITZ BATH, TYLENOL, IBUPROFEN ALSO FOR PAIN __________________________________________________________________________________________________________________________________________________________________________________________  WHEN TO CALL YOUR DOCTOR: 1. Fever over 101.0 2. Inability to urinate 3. Nausea and/or vomiting 4. Extreme swelling or bruising 5. Continued bleeding from rectum. 6. Increased pain, redness, or drainage from the incision 7. Constipation  The clinic staff is available to answer your questions during regular business hours.  Please dont hesitate to call and ask to speak to one of the nurses for clinical concerns.  If you have a medical emergency, go to the nearest emergency room or call 911.  A surgeon from [facility] is always on call at the hospital   [address]  [address], [place], [place], [place]  [address] ?  [address], [place], [place]   [address] [phone] ? [phone] ? FAX [phone] Web site: [email or web address]   Post  Anesthesia Home Care Instructions  Activity: Get plenty of rest for the remainder of the day. A responsible individual must stay with you for 24 hours following the procedure.  For the next 24 hours, DO NOT: -Drive a car -Paediatric nurse -Drink alcoholic beverages -Take any medication unless instructed by your physician -Make any legal decisions or sign important papers.  Meals: Start with liquid foods such as gelatin or soup. Progress to regular foods as tolerated. Avoid greasy, spicy, heavy foods. If nausea and/or vomiting occur, drink only clear liquids until the nausea and/or vomiting subsides. Call your physician if vomiting continues.  Special Instructions/Symptoms: Your throat may feel dry or sore from the anesthesia or the breathing tube placed in your throat during surgery. If this causes discomfort, gargle with warm salt water. The discomfort should disappear within 24 hours.  If you had a scopolamine patch placed behind your ear for the management of post- operative nausea and/or vomiting:  1. The medication in the patch is effective for 72 hours, after which it should be removed.  Wrap patch in a tissue and discard in the trash. Wash hands thoroughly with soap and water. 2. You may remove the patch earlier than 72 hours if you experience unpleasant side effects which may include dry mouth, dizziness or visual disturbances. 3. Avoid touching the patch. Wash your hands with soap and water after contact with the patch.

## 2016-10-28 ENCOUNTER — Encounter (HOSPITAL_BASED_OUTPATIENT_CLINIC_OR_DEPARTMENT_OTHER): Payer: Self-pay | Admitting: Surgery

## 2017-01-01 DIAGNOSIS — L720 Epidermal cyst: Secondary | ICD-10-CM | POA: Diagnosis not present

## 2017-01-28 MED FILL — PENICILLIN VK 500 MG TABLET: 500 | 7 days supply | Qty: 30 | Fill #0

## 2017-01-29 MED FILL — MELOXICAM 15 MG TABLET: 15 | 90 days supply | Qty: 90 | Fill #1

## 2017-02-19 MED FILL — ESOMEPRAZOLE MAG DR 40 MG C: 40 | 90 days supply | Qty: 90 | Fill #1

## 2017-03-04 DIAGNOSIS — T189XXA Foreign body of alimentary tract, part unspecified, initial encounter: Secondary | ICD-10-CM | POA: Diagnosis not present

## 2017-03-05 ENCOUNTER — Other Ambulatory Visit (HOSPITAL_COMMUNITY): Payer: Self-pay | Admitting: Family Medicine

## 2017-03-05 ENCOUNTER — Ambulatory Visit (HOSPITAL_COMMUNITY)
Admission: RE | Admit: 2017-03-05 | Discharge: 2017-03-05 | Disposition: A | Discharge status: Home / Self Care | Payer: 59 | Source: Ambulatory Visit | Attending: Family Medicine | Admitting: Family Medicine

## 2017-03-05 DIAGNOSIS — X58XXXA Exposure to other specified factors, initial encounter: Secondary | ICD-10-CM | POA: Insufficient documentation

## 2017-03-05 DIAGNOSIS — T189XXA Foreign body of alimentary tract, part unspecified, initial encounter: Secondary | ICD-10-CM | POA: Insufficient documentation

## 2017-03-05 DIAGNOSIS — T182XXA Foreign body in stomach, initial encounter: Secondary | ICD-10-CM | POA: Diagnosis not present

## 2017-06-05 MED FILL — ESOMEPRAZOLE MAG DR 40 MG C: 40 | 90 days supply | Qty: 90 | Fill #2

## 2017-06-05 MED FILL — MELOXICAM 15 MG TABLET: 15 | 90 days supply | Qty: 90 | Fill #2

## 2017-06-25 DIAGNOSIS — L309 Dermatitis, unspecified: Secondary | ICD-10-CM | POA: Diagnosis not present

## 2017-06-25 MED FILL — TRIAMCINOLONE 0.1% OINTMENT: 0.1 | 20 days supply | Qty: 80 | Fill #0

## 2017-08-27 DIAGNOSIS — E782 Mixed hyperlipidemia: Secondary | ICD-10-CM | POA: Diagnosis not present

## 2017-08-27 DIAGNOSIS — K5730 Diverticulosis of large intestine without perforation or abscess without bleeding: Secondary | ICD-10-CM | POA: Diagnosis not present

## 2017-08-27 DIAGNOSIS — R7301 Impaired fasting glucose: Secondary | ICD-10-CM | POA: Diagnosis not present

## 2017-08-30 IMAGING — MR MR PELVIS WO/W CM
4 of 8 series · 19 of 48 positions shown · IV contrast (multihance)
Comparison: 10/18/2014 MRI.  12/04/2003 CT.

CLINICAL DATA: Perirectal abscess. Evaluate for perianal fistula.
Symptoms for 3 months.

EXAM:
MRI PELVIS WITHOUT AND WITH CONTRAST
TECHNIQUE: Multiplanar multisequence MR imaging of the pelvis was performed
both before and after administration of intravenous contrast.
CONTRAST:  15mL MULTIHANCE GADOBENATE DIMEGLUMINE 529 MG/ML IV SOLN

[Series 3: T2 · sagittal · 2.0mm · 0.51mm/px · 8 of 60 slices shown (1 of 2)]
[im 1/60]
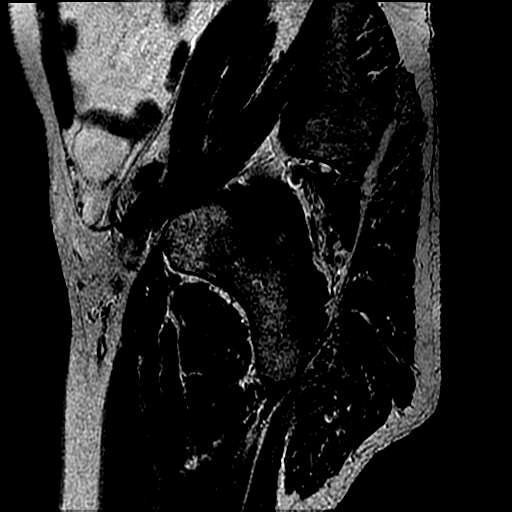
[im 7/60]
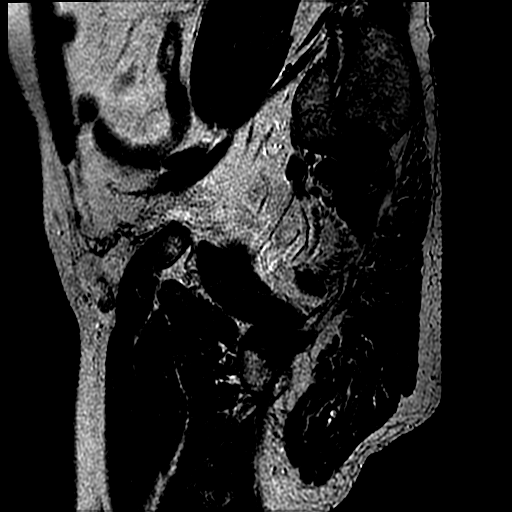
[im 20/60]
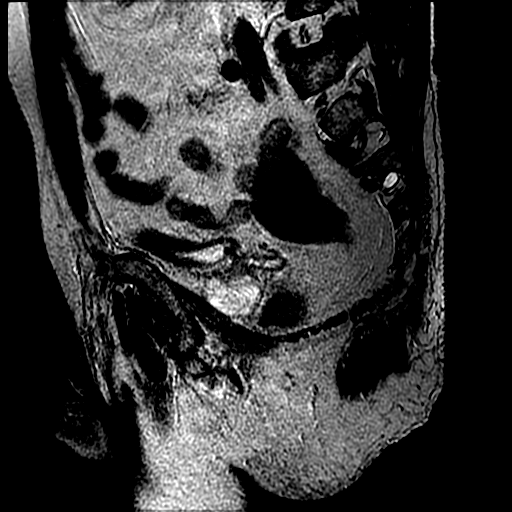
[im 27/60]
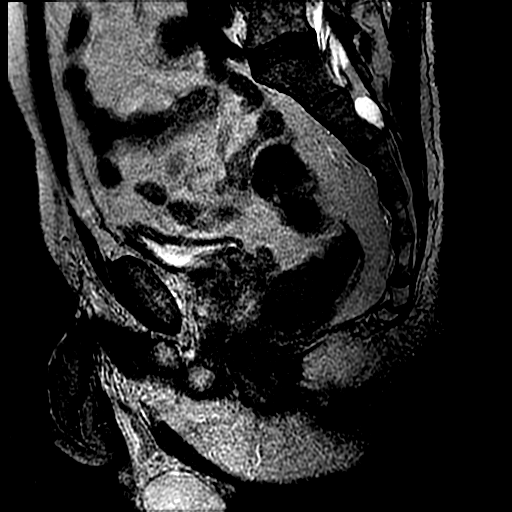
[im 33/60]
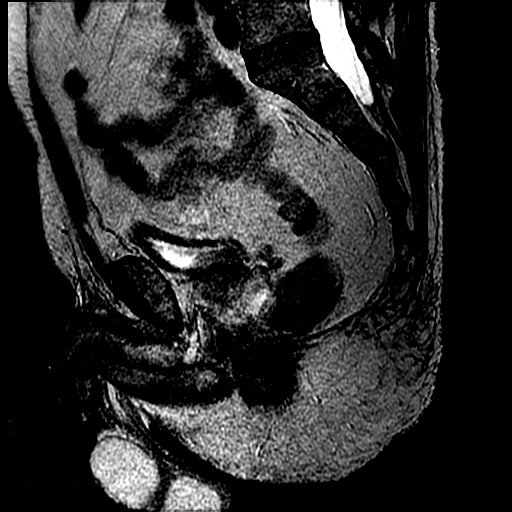
[im 40/60]
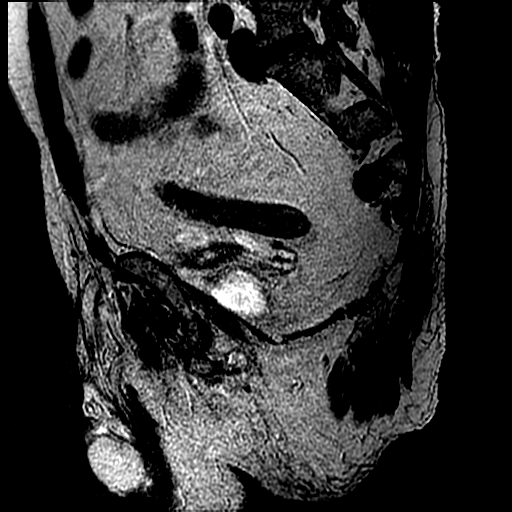
[im 53/60]
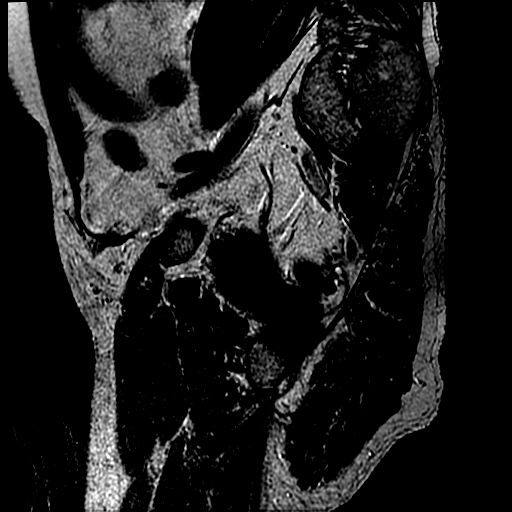
[im 60/60]
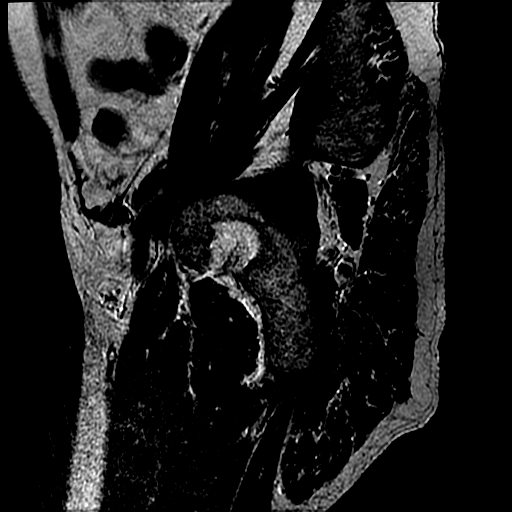

[Series 4: T2 fat-sat · sagittal · 2.0mm · 0.51mm/px · 5 of 60 slices shown]
[im 1/60]
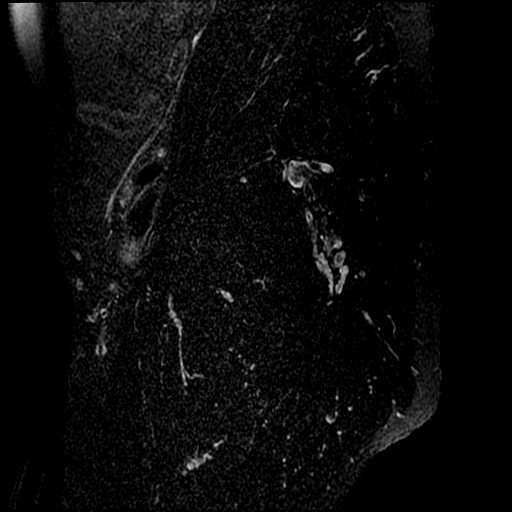
[im 7/60]
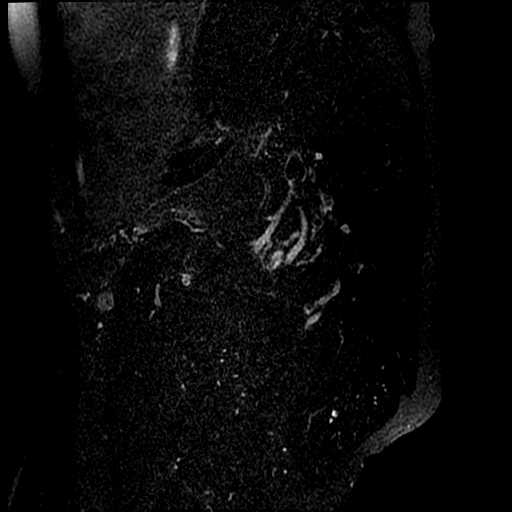
[im 20/60]
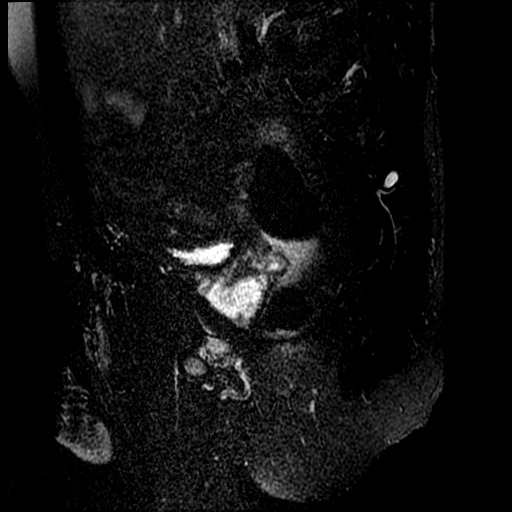
[im 33/60]
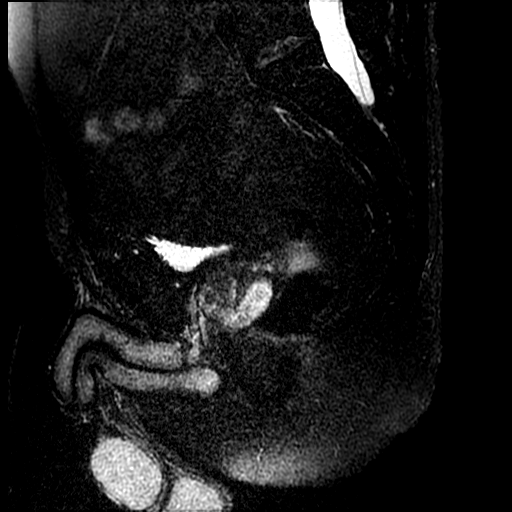
[im 53/60]
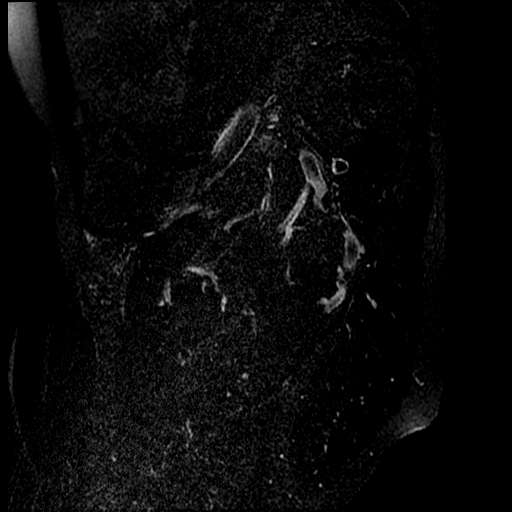

[Series 5: T1 · axial · 4.0mm · 0.43mm/px · z∈[-48,+84]mm · 3 of 29 slices shown]
[im 1/29]
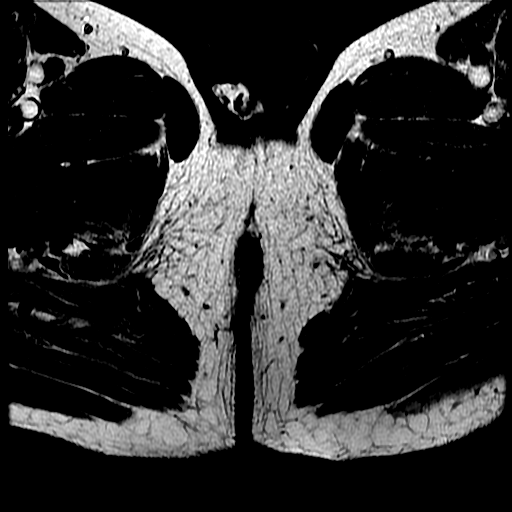
[im 15/29]
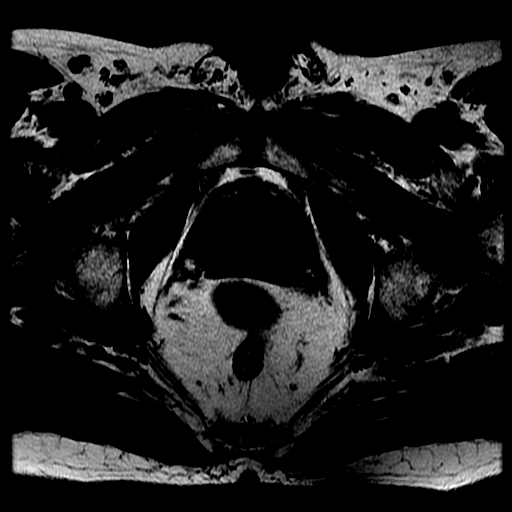
[im 29/29]
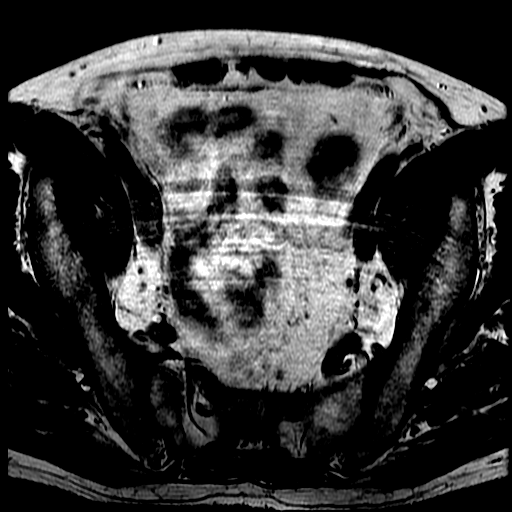

[Series 6: T2 · axial · 4.0mm · 0.43mm/px · z∈[-48,+84]mm · 3 of 29 slices shown (2 of 2)]
[im 1/29]
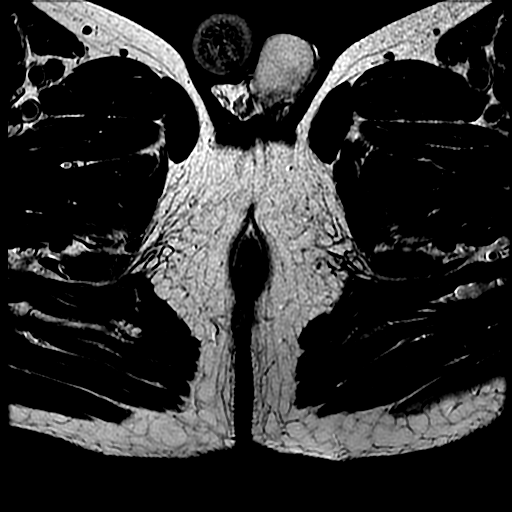
[im 15/29]
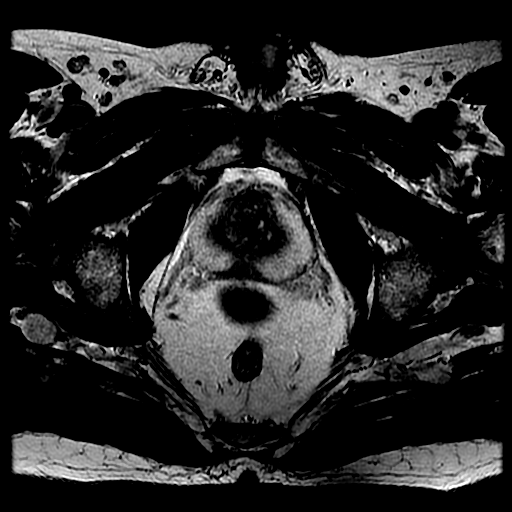
[im 29/29]
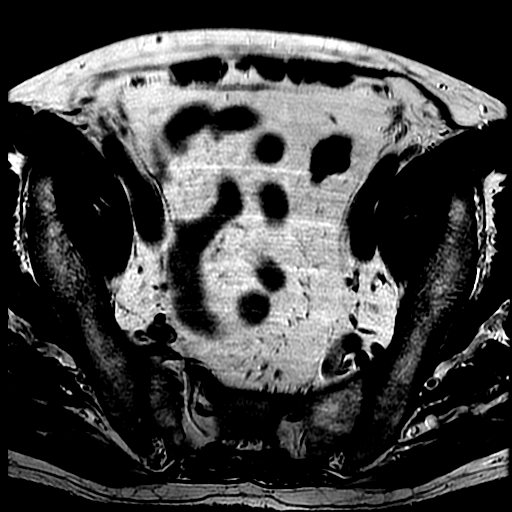

[19 of 48 positions shown; findings below may reference images not displayed]

FINDINGS: Urinary Tract:  Normal urinary bladder.

Bowel: Sigmoid diverticulosis. No evidence of rectal or anal wall
thickening. No well-defined perirectal or perianal abscess. About
the posterior anal introitus, there is an area of T2 hyperintensity
and mild enhancement which measures on the order of 10 mm on image
29/ series 7 and image 28/series 10. No well-defined fistulous
tract.

Vascular/Lymphatic: No pelvic aneurysm or sidewall adenopathy.

Reproductive:  Normal prostate on this nondedicated study.

Other:  No significant free fluid.

Musculoskeletal: No acute osseous abnormality.
IMPRESSION: Small focus of T2 hyperintensity and mild post-contrast enhancement
about the posterior aspect of the anal introitus. This could
represent an area perianal inflammation and possibly a skin tag. No
well-defined fistula and no evidence of perianal or perirectal
abscess.

## 2017-08-31 DIAGNOSIS — R7301 Impaired fasting glucose: Secondary | ICD-10-CM | POA: Diagnosis not present

## 2017-08-31 DIAGNOSIS — K219 Gastro-esophageal reflux disease without esophagitis: Secondary | ICD-10-CM | POA: Diagnosis not present

## 2017-08-31 DIAGNOSIS — M1612 Unilateral primary osteoarthritis, left hip: Secondary | ICD-10-CM | POA: Diagnosis not present

## 2017-08-31 DIAGNOSIS — K5909 Other constipation: Secondary | ICD-10-CM | POA: Diagnosis not present

## 2017-09-01 MED FILL — CYCLOBENZAPRINE 5 MG TABLET: 5 | 20 days supply | Qty: 60 | Fill #0

## 2017-09-01 MED FILL — ESOMEPRAZOLE MAG DR 40 MG C: 40 | 90 days supply | Qty: 90 | Fill #3

## 2017-09-01 MED FILL — MELOXICAM 15 MG TABLET: 15 | 90 days supply | Qty: 90 | Fill #3

## 2017-09-02 MED FILL — PRAMOSONE 1-2.5 % OINT: 1-2.5 | 15 days supply | Qty: 28 | Fill #0

## 2017-10-28 MED FILL — PRAMOSONE 1-2.5 % OINT: 1-2.5 | 15 days supply | Qty: 28 | Fill #1

## 2017-12-29 MED FILL — ESOMEPRAZOLE MAG DR 40 MG C: 40 | 90 days supply | Qty: 90 | Fill #0

## 2017-12-29 MED FILL — PRAMOSONE 1-2.5 % OINT: 1-2.5 | 15 days supply | Qty: 28 | Fill #2

## 2017-12-29 MED FILL — MELOXICAM 15 MG TABLET: 15 | 90 days supply | Qty: 90 | Fill #0

## 2018-01-17 ENCOUNTER — Ambulatory Visit (INDEPENDENT_AMBULATORY_CARE_PROVIDER_SITE_OTHER): Payer: Self-pay | Admitting: Family Medicine

## 2018-01-17 ENCOUNTER — Encounter: Payer: Self-pay | Admitting: Family Medicine

## 2018-01-17 VITALS — BP 120/70 | Temp 99.2°F | Ht 70.0 in | Wt 162.8 lb

## 2018-01-17 DIAGNOSIS — R6889 Other general symptoms and signs: Secondary | ICD-10-CM

## 2018-01-17 LAB — POCT INFLUENZA A/B
INFLUENZA A, POC: NEGATIVE
Influenza B, POC: NEGATIVE

## 2018-01-17 MED ORDER — BENZONATATE 100 MG PO CAPS: 100.0000 mg | ORAL_CAPSULE | Freq: Three times a day (TID) | ORAL | 0 refills | Status: DC | PRN

## 2018-01-17 MED ORDER — OSELTAMIVIR PHOSPHATE 75 MG PO CAPS: 75.0000 mg | ORAL_CAPSULE | Freq: Two times a day (BID) | ORAL | 0 refills | Status: DC

## 2018-01-17 NOTE — Patient Instructions (Addendum)

## 2018-01-17 NOTE — Progress Notes (Signed)
Patient ID: Albert [name], male    DOB: 28-Dec-1955, 62 y.o.   MRN: [id number]  PCP: Christain Sacramento, MD  Chief Complaint  Patient presents with  . URI    Subjective:  HPI Albert [name] is a 62 y.o. male presents for evaluation of body aches, cough, headache, and fatigue x 3 days. Reports no known sick contacts. Symptoms initially began as body aches which have increased in intensity. He also complains of a persistent dry cough which was worse throughout the night. His throat is sore. Uncertain of fever.  Denies shortness of breath, wheezing, or chest tightness. He is daily smoker. No prior diagnosis of COPD or asthma. Social History   Socioeconomic History  . Marital status: Married    Spouse name: Not on file  . Number of children: Not on file  . Years of education: Not on file  . Highest education level: Not on file  Occupational History  . Occupation: [employer]  . Financial resource strain: Not on file  . Food insecurity:    Worry: Not on file    Inability: Not on file  . Transportation needs:    Medical: Not on file    Non-medical: Not on file  Tobacco Use  . Smoking status: Current Every Day Smoker    Packs/day: 0.25    Types: Cigarettes  . Smokeless tobacco: Never Used  . Tobacco comment: smokes 3-4 cigs/day, maybe a cigar  Substance and Sexual Activity  . Alcohol use: No  . Drug use: No  . Sexual activity: Not on file  Lifestyle  . Physical activity:    Days per week: Not on file    Minutes per session: Not on file  . Stress: Not on file  Relationships  . Social connections:    Talks on phone: Not on file    Gets together: Not on file    Attends religious service: Not on file    Active member of club or organization: Not on file    Attends meetings of clubs or organizations: Not on file    Relationship status: Not on file  . Intimate partner violence:    Fear of current or ex partner: Not on file    Emotionally abused: Not  on file    Physically abused: Not on file    Forced sexual activity: Not on file  Other Topics Concern  . Not on file  Social History Narrative  . Not on file    Family History  Problem Relation Age of Onset  . Esophageal cancer Brother    Review of Systems  Pertinent negatives listed in HPI  Patient Active Problem List   Diagnosis Date Noted  . ESOPHAGEAL REFLUX 08/02/2008  . PERSONAL HISTORY OF COLONIC POLYPS 08/02/2008    No Known Allergies  Prior to Admission medications   Medication Sig Start Date End Date Taking? Authorizing Provider  esomeprazole (NEXIUM) 40 MG capsule Take 1 capsule (40 mg total) by mouth daily before breakfast. 07/15/16  Yes Lemmon, Lavone Nian, PA  ibuprofen (ADVIL,MOTRIN) 200 MG tablet Take 200 mg by mouth every 6 (six) hours as needed.   Yes [provider]  meloxicam (MOBIC) 7.5 MG tablet Take 7.5 mg by mouth daily.   Yes [provider]  lidocaine (XYLOCAINE) 5 % ointment Apply 1 application topically as needed. Patient not taking: Reported on 01/17/2018 10/27/16   Coralie Keens, MD  oxyCODONE (OXY IR/ROXICODONE) 5 MG immediate release  tablet Take 1-2 tablets (5-10 mg total) by mouth every 6 (six) hours as needed for moderate pain, severe pain or breakthrough pain. Patient not taking: Reported on 01/17/2018 10/27/16   Coralie Keens, MD    Past Medical, Surgical Family and Social History reviewed and updated.    Objective:   Today's Vitals   01/17/18 1252  BP: 120/70  Temp: 99.2 F (37.3 C)  TempSrc: Oral  SpO2: 97%  Weight: 162 lb 12.8 oz (73.8 kg)  Height: 5\' 10"  (1.778 m)    Wt Readings from Last 3 Encounters:  01/17/18 162 lb 12.8 oz (73.8 kg)  10/27/16 159 lb (72.1 kg)  08/11/16 160 lb (72.6 kg)    Physical Exam  Constitutional: He appears ill.  HENT:  Head: Normocephalic and atraumatic.  Right Ear: External ear normal.  Left Ear: External ear normal.  Mouth/Throat: Uvula is midline. Posterior  oropharyngeal edema and posterior oropharyngeal erythema present. No oropharyngeal exudate.  Cardiovascular: Normal rate, regular rhythm, normal heart sounds and intact distal pulses.  Pulmonary/Chest: Effort normal.  Diminished breath sounds thought lung fields.   Assessment & Plan:  1. Flu-like symptoms Treating symptomatically and will prescribe Tami-Flu as patient within the 72 hours window for medication to be most effective. Rapid flu negative. Given fever and symptoms, strongly suspect possible influenza as the underlying cause of symptoms.  Meds ordered this encounter  Medications  . oseltamivir (TAMIFLU) 75 MG capsule    Sig: Take 1 capsule (75 mg total) by mouth 2 (two) times daily.    Dispense:  10 capsule    Refill:  0  . benzonatate (TESSALON) 100 MG capsule    Sig: Take 1-2 capsules (100-200 mg total) by mouth 3 (three) times daily as needed for cough.    Dispense:  40 capsule    Refill:  0    Carroll Sage. Kenton Kingfisher, MSN, FNP-C [address] [place], [place] [address] [phone]   If symptoms worsen or do not improve, return for follow-up, follow-up with PCP, or at the emergency department if severity of symptoms warrant a higher level of care.

## 2018-01-26 ENCOUNTER — Encounter: Payer: Self-pay | Admitting: Family Medicine

## 2018-01-26 ENCOUNTER — Ambulatory Visit (INDEPENDENT_AMBULATORY_CARE_PROVIDER_SITE_OTHER): Payer: Self-pay | Admitting: Family Medicine

## 2018-01-26 VITALS — BP 121/70 | HR 89 | Temp 98.4°F | Wt 167.0 lb

## 2018-01-26 DIAGNOSIS — J039 Acute tonsillitis, unspecified: Secondary | ICD-10-CM

## 2018-01-26 MED ORDER — AMOXICILLIN 875 MG PO TABS: 875.0000 mg | ORAL_TABLET | Freq: Two times a day (BID) | ORAL | 0 refills | Status: DC

## 2018-01-26 NOTE — Progress Notes (Signed)
Albert [name] is a 62 y.o. male who presents today with concerns of a tender swollen lymphnode to the right side of his neck for the last 2 days. He reports recently being treated for flu like symptoms and states that once he completed the Tamiflu that his throat began hurting. He confirms a known history of tonsil stones. He reports that he has still being running a fever and sweating and experiencing fatigue. He denies cough.  Review of Systems  Constitutional: Negative for chills, fever and malaise/fatigue.  HENT: Positive for sore throat. Negative for congestion, ear discharge, ear pain and sinus pain.   Eyes: Negative.   Respiratory: Negative for cough, sputum production and shortness of breath.   Cardiovascular: Negative.  Negative for chest pain.  Gastrointestinal: Negative for abdominal pain, diarrhea, nausea and vomiting.  Genitourinary: Negative for dysuria, frequency, hematuria and urgency.  Musculoskeletal: Positive for neck pain. Negative for myalgias.  Skin: Negative.   Neurological: Negative for headaches.  Endo/Heme/Allergies: Negative.   Psychiatric/Behavioral: Negative.     O: Vitals:   01/26/18 1645  BP: 121/70  Pulse: 89  Temp: 98.4 F (36.9 C)  SpO2: 97%     Physical Exam  Constitutional: He is oriented to person, place, and time. Vital signs are normal. He appears well-developed and well-nourished. He is active.  Non-toxic appearance. He does not have a sickly appearance.  HENT:  Head: Normocephalic.  Right Ear: Hearing, tympanic membrane, external ear and ear canal normal.  Left Ear: Hearing, tympanic membrane, external ear and ear canal normal.  Nose: Nose normal.  Mouth/Throat: Uvula is midline and oropharynx is clear and moist.  Neck: Normal range of motion. Neck supple.  Cardiovascular: Normal rate, regular rhythm, normal heart sounds and normal pulses.  Pulmonary/Chest: Effort normal. He has wheezes in the right lower field and the left lower  field. He has rales in the right upper field, the right middle field and the left upper field.  Abdominal: Soft. Bowel sounds are normal.  Musculoskeletal: Normal range of motion.  Lymphadenopathy:       Head (right side): No submental and no submandibular adenopathy present.       Head (left side): No submental and no submandibular adenopathy present.    He has no cervical adenopathy.  Neurological: He is alert and oriented to person, place, and time.  Psychiatric: He has a normal mood and affect.  Vitals reviewed.  A: 1. Tonsillitis    P: Discussed exam findings, diagnosis etiology and medication use and indications reviewed with patient. Follow- Up and discharge instructions provided. No emergent/urgent issues found on exam.  Patient verbalized understanding of information provided and agrees with plan of care (POC), all questions answered.  1. Tonsillitis - amoxicillin (AMOXIL) 875 MG tablet; Take 1 tablet (875 mg total) by mouth 2 (two) times daily.

## 2018-01-26 NOTE — Patient Instructions (Signed)
Tonsillitis Tonsillitis is an infection of the throat. This infection causes the tonsils to become red, tender, and swollen. Tonsils are tissues in the back of your throat. If bacteria caused your infection, antibiotic medicine will be given to you. Sometimes, symptoms of this infection can be helped with the use of steroid medicine. If your tonsillitis is very bad (severe) and happens often, you may need to get your tonsils removed (tonsillectomy). Follow these instructions at home: Medicines  Take over-the-counter and prescription medicines only as told by your doctor.  If you were prescribed an antibiotic, take it as told by your doctor. Do not stop taking the antibiotic even if you start to feel better. Eating and drinking  Drink enough fluid to keep your pee (urine) clear or pale yellow.  While your throat is sore, eat soft or liquid foods like: ? Soup. ? Sherbert. ? Instant breakfast drinks.  Drink warm fluids.  Eat frozen ice pops. General instructions  Rest as much as possible and get plenty of sleep.  Gargle with a salt-water mixture 3-4 times a day or as needed. To make a salt-water mixture, completely dissolve -1 tsp of salt in 1 cup of warm water.  Wash your hands often with soap and water. If there is no soap and water, use hand sanitizer.  Do not share cups, bottles, or other utensils until your symptoms are gone.  Do not smoke. If you need help quitting, ask your doctor.  Keep all follow-up visits as told by your doctor. This is important. Contact a doctor if:  You have large, tender lumps in your neck.  You have a fever that does not go away after 2-3 days.  You have a rash.  You cough up green, yellow-brown, or bloody fluid.  You cannot swallow liquids or food for 24 hours.  Only one of your tonsils is swollen. Get help right away if:  You have any new symptoms such as: ? Vomiting ? Very bad headache ? Stiff neck ? Chest pain ? Trouble breathing  or swallowing  You have very bad throat pain and also have drooling or voice changes.  You have very bad pain that is not helped by medicine.  You cannot fully open your mouth.  You have redness, swelling, or severe pain anywhere in your neck. Summary  Tonsillitis causes your tonsils to be red, tender, and swollen.  While your throat is sore eat soft or liquid foods.  Gargle with a salt-water mixture 3-4 times a day or as needed.  Do not share cups, bottles, or other utensils until your symptoms are gone. This information is not intended to replace advice given to you by your health care provider. Make sure you discuss any questions you have with your health care provider. Document Released: 08/27/2007 Document Revised: 08/16/2015 Document Reviewed: 08/27/2012 Elsevier Interactive Patient Education  2017 [employer]. Lymphadenopathy Lymphadenopathy refers to swollen or enlarged lymph glands, also called lymph nodes. Lymph glands are part of your body's defense (immune) system, which protects the body from infections, germs, and diseases. Lymph glands are found in many locations in your body, including the neck, underarm, and groin. Many things can cause lymph glands to become enlarged. When your immune system responds to germs, such as viruses or bacteria, infection-fighting cells and fluid build up. This causes the glands to grow in size. Usually, this is not something to worry about. The swelling and any soreness often go away without treatment. However, swollen lymph glands can  also be caused by a number of diseases. Your health care provider may do various tests to help determine the cause. If the cause of your swollen lymph glands cannot be found, it is important to monitor your condition to make sure the swelling goes away. Follow these instructions at home: Watch your condition for any changes. The following actions may help to lessen any discomfort you are feeling:  Get plenty of  rest.  Take medicines only as directed by your health care provider. Your health care provider may recommend over-the-counter medicines for pain.  Apply moist heat compresses to the site of swollen lymph nodes as directed by your health care provider. This can help reduce any pain.  Check your lymph nodes daily for any changes.  Keep all follow-up visits as directed by your health care provider. This is important.  Contact a health care provider if:  Your lymph nodes are still swollen after 2 weeks.  Your swelling increases or spreads to other areas.  Your lymph nodes are hard, seem fixed to the skin, or are growing rapidly.  Your skin over the lymph nodes is red and inflamed.  You have a fever.  You have chills.  You have fatigue.  You develop a sore throat.  You have abdominal pain.  You have weight loss.  You have night sweats. Get help right away if:  You notice fluid leaking from the area of the enlarged lymph node.  You have severe pain in any area of your body.  You have chest pain.  You have shortness of breath. This information is not intended to replace advice given to you by your health care provider. Make sure you discuss any questions you have with your health care provider. Document Released: 12/18/2007 Document Revised: 08/16/2015 Document Reviewed: 10/13/2013 Elsevier Interactive Patient Education  [employer].

## 2018-04-25 MED FILL — ESOMEPRAZOLE MAG DR 40 MG C: 40 | 90 days supply | Qty: 90 | Fill #1

## 2018-04-25 MED FILL — MELOXICAM 15 MG TABLET: 15 | 90 days supply | Qty: 90 | Fill #1

## 2018-04-26 MED FILL — PRAMOSONE 1-2.5 % OINT: 1-2.5 | 15 days supply | Qty: 28 | Fill #3

## 2018-07-06 MED FILL — ESOMEPRAZOLE MAG DR 40 MG C: 40 | 90 days supply | Qty: 90 | Fill #2

## 2018-07-28 MED FILL — PRAMOSONE 1-2.5 % OINT: 1-2.5 | 15 days supply | Qty: 28 | Fill #4

## 2018-08-31 DIAGNOSIS — E782 Mixed hyperlipidemia: Secondary | ICD-10-CM | POA: Diagnosis not present

## 2018-08-31 DIAGNOSIS — R7301 Impaired fasting glucose: Secondary | ICD-10-CM | POA: Diagnosis not present

## 2018-09-02 DIAGNOSIS — M545 Low back pain: Secondary | ICD-10-CM | POA: Diagnosis not present

## 2018-09-02 DIAGNOSIS — M129 Arthropathy, unspecified: Secondary | ICD-10-CM | POA: Diagnosis not present

## 2018-09-02 DIAGNOSIS — N529 Male erectile dysfunction, unspecified: Secondary | ICD-10-CM | POA: Diagnosis not present

## 2018-09-02 DIAGNOSIS — K219 Gastro-esophageal reflux disease without esophagitis: Secondary | ICD-10-CM | POA: Diagnosis not present

## 2018-09-02 DIAGNOSIS — G8929 Other chronic pain: Secondary | ICD-10-CM | POA: Diagnosis not present

## 2018-09-02 MED FILL — HYDROCORTISONE-PRAMOXINE CR: 2.5-1 | 14 days supply | Qty: 28 | Fill #0

## 2018-09-02 MED FILL — CYCLOBENZAPRINE HCL 5 MG TA: 5 | 20 days supply | Qty: 60 | Fill #0

## 2018-09-02 MED FILL — MELOXICAM 15 MG TABLET: 15 | 90 days supply | Qty: 90 | Fill #0

## 2018-10-30 MED FILL — ESOMEPRAZOLE MAG DR 40 MG C: 40 | 90 days supply | Qty: 90 | Fill #0

## 2019-01-28 MED FILL — ESOMEPRAZOLE MAG DR 40 MG C: 40 | 90 days supply | Qty: 90 | Fill #1

## 2019-01-28 MED FILL — HYDROCORTISONE-PRAMOXINE CR: 2.5-1 | 14 days supply | Qty: 28 | Fill #1

## 2019-01-31 MED FILL — HYDROCORTISONE-PRAMOXINE CR: 2.5-1 | 14 days supply | Qty: 28 | Fill #1

## 2019-02-16 DIAGNOSIS — L0211 Cutaneous abscess of neck: Secondary | ICD-10-CM | POA: Diagnosis not present

## 2019-05-14 MED FILL — MELOXICAM 15 MG TABLET: 15 | 90 days supply | Qty: 90 | Fill #1

## 2019-05-14 MED FILL — HYDROCORTISONE-PRAMOXINE CR: 2.5-1 | 14 days supply | Qty: 28 | Fill #2

## 2019-05-14 MED FILL — ESOMEPRAZOLE MAG DR 40 MG C: 40 | 90 days supply | Qty: 90 | Fill #2

## 2019-05-14 MED FILL — CYCLOBENZAPRINE HCL 5 MG TA: 5 | 20 days supply | Qty: 60 | Fill #1

## 2019-08-30 ENCOUNTER — Other Ambulatory Visit (HOSPITAL_COMMUNITY): Payer: Self-pay | Admitting: Family Medicine

## 2019-08-30 DIAGNOSIS — Z23 Encounter for immunization: Secondary | ICD-10-CM | POA: Diagnosis not present

## 2019-08-30 DIAGNOSIS — M1612 Unilateral primary osteoarthritis, left hip: Secondary | ICD-10-CM | POA: Diagnosis not present

## 2019-08-30 DIAGNOSIS — M129 Arthropathy, unspecified: Secondary | ICD-10-CM | POA: Diagnosis not present

## 2019-08-30 DIAGNOSIS — M545 Low back pain: Secondary | ICD-10-CM | POA: Diagnosis not present

## 2019-08-30 DIAGNOSIS — L570 Actinic keratosis: Secondary | ICD-10-CM | POA: Diagnosis not present

## 2019-08-30 DIAGNOSIS — Z125 Encounter for screening for malignant neoplasm of prostate: Secondary | ICD-10-CM | POA: Diagnosis not present

## 2019-08-30 DIAGNOSIS — K219 Gastro-esophageal reflux disease without esophagitis: Secondary | ICD-10-CM | POA: Diagnosis not present

## 2019-08-30 DIAGNOSIS — E782 Mixed hyperlipidemia: Secondary | ICD-10-CM | POA: Diagnosis not present

## 2019-08-30 DIAGNOSIS — G8929 Other chronic pain: Secondary | ICD-10-CM | POA: Diagnosis not present

## 2019-09-23 ENCOUNTER — Ambulatory Visit (AMBULATORY_SURGERY_CENTER): Payer: Self-pay | Admitting: *Deleted

## 2019-09-23 ENCOUNTER — Other Ambulatory Visit: Payer: Self-pay

## 2019-09-23 VITALS — Ht 70.0 in | Wt 162.0 lb

## 2019-09-23 DIAGNOSIS — Z86010 Personal history of colonic polyps: Secondary | ICD-10-CM

## 2019-09-23 DIAGNOSIS — Z800 Family history of malignant neoplasm of digestive organs: Secondary | ICD-10-CM

## 2019-09-23 DIAGNOSIS — K219 Gastro-esophageal reflux disease without esophagitis: Secondary | ICD-10-CM

## 2019-09-23 MED ORDER — SUPREP BOWEL PREP KIT 17.5-3.13-1.6 GM/177ML PO SOLN: 1.0000 | Freq: Once | ORAL | 0 refills | Status: AC

## 2019-09-23 MED FILL — SUPREP BOWEL PREP KIT: 17.5-3.13-1 | 1 days supply | Qty: 354 | Fill #0

## 2019-09-23 NOTE — Progress Notes (Signed)
04-14-19 cov vacc x 2   No egg or soy allergy known to patient  No issues with past sedation with any surgeries  or procedures, no intubation problems  No diet pills per patient No home 02 use per patient  No blood thinners per patient  Pt denies issues with constipation  No A fib or A flutter  EMMI video sent to pt's e mail  COVID 19 guidelines implemented in [facility] today   Due to the COVID-19 pandemic we are asking patients to follow these guidelines. Please only bring one care partner. Please be aware that your care partner may wait in the car in the parking lot or if they feel like they will be too hot to wait in the car, they may wait in the lobby on the 4th floor. All care partners are required to wear a mask the entire time (we do not have any that we can provide them), they need to practice social distancing, and we will do a Covid check for all patient's and care partners when you arrive. Also we will check their temperature and your temperature. If the care partner waits in their car they need to stay in the parking lot the entire time and we will call them on their cell phone when the patient is ready for discharge so they can bring the car to the front of the building. Also all patient's will need to wear a mask into building.

## 2019-09-30 ENCOUNTER — Encounter: Payer: Self-pay | Admitting: Internal Medicine

## 2019-10-07 ENCOUNTER — Ambulatory Visit (AMBULATORY_SURGERY_CENTER): Payer: 59 | Admitting: Internal Medicine

## 2019-10-07 ENCOUNTER — Encounter: Payer: Self-pay | Admitting: Internal Medicine

## 2019-10-07 ENCOUNTER — Other Ambulatory Visit: Payer: Self-pay

## 2019-10-07 VITALS — BP 125/72 | HR 61 | Temp 97.5°F | Resp 19 | Ht 70.0 in | Wt 162.0 lb

## 2019-10-07 DIAGNOSIS — K635 Polyp of colon: Secondary | ICD-10-CM | POA: Diagnosis not present

## 2019-10-07 DIAGNOSIS — D123 Benign neoplasm of transverse colon: Secondary | ICD-10-CM | POA: Diagnosis not present

## 2019-10-07 DIAGNOSIS — Z1211 Encounter for screening for malignant neoplasm of colon: Secondary | ICD-10-CM | POA: Diagnosis not present

## 2019-10-07 DIAGNOSIS — K219 Gastro-esophageal reflux disease without esophagitis: Secondary | ICD-10-CM

## 2019-10-07 DIAGNOSIS — Z86010 Personal history of colonic polyps: Secondary | ICD-10-CM | POA: Diagnosis not present

## 2019-10-07 DIAGNOSIS — D124 Benign neoplasm of descending colon: Secondary | ICD-10-CM | POA: Diagnosis not present

## 2019-10-07 DIAGNOSIS — Z800 Family history of malignant neoplasm of digestive organs: Secondary | ICD-10-CM | POA: Diagnosis not present

## 2019-10-07 MED ORDER — SODIUM CHLORIDE 0.9 % IV SOLN: 500.0000 mL | Freq: Once | INTRAVENOUS | Status: DC

## 2019-10-07 NOTE — Progress Notes (Signed)
Pt's states no medical or surgical changes since previsit or office visit.  VS CW  

## 2019-10-07 NOTE — Op Note (Signed)
[facility] Patient Name: Albert [name] Procedure Date: 10/07/2019 10:31 AM MRN: [id number] Endoscopist: Jerene Bears , MD Age: 64 Referring MD:  Date of Birth: 01/12/1956 Gender: Male Account #: [id number] Procedure:                Upper GI endoscopy Indications:              Gastro-esophageal reflux disease controlled with                            Nexium, Family history of esophageal cancer in                            patient's brother Medicines:                Monitored Anesthesia Care Procedure:                Pre-Anesthesia Assessment:                           - Prior to the procedure, a History and Physical                            was performed, and patient medications and                            allergies were reviewed. The patient's tolerance of                            previous anesthesia was also reviewed. The risks                            and benefits of the procedure and the sedation                            options and risks were discussed with the patient.                            All questions were answered, and informed consent                            was obtained. Prior Anticoagulants: The patient has                            taken no previous anticoagulant or antiplatelet                            agents. ASA Grade Assessment: II - A patient with                            mild systemic disease. After reviewing the risks                            and benefits, the patient was deemed in  satisfactory condition to undergo the procedure.                           After obtaining informed consent, the endoscope was                            passed under direct vision. Throughout the                            procedure, the patient's blood pressure, pulse, and                            oxygen saturations were monitored continuously. The                            Endoscope was introduced through the mouth,  and                            advanced to the second part of duodenum. The upper                            GI endoscopy was accomplished without difficulty.                            The patient tolerated the procedure well. Scope In: Scope Out: Findings:                 The esophagus was normal.                           The stomach was normal.                           The examined duodenum was normal. Complications:            No immediate complications. Estimated Blood Loss:     Estimated blood loss: none. Impression:               - Normal esophagus.                           - Normal stomach.                           - Normal examined duodenum.                           - No specimens collected. Recommendation:           - Patient has a contact number available for                            emergencies. The signs and symptoms of potential                            delayed complications were discussed with the  patient. Return to normal activities tomorrow.                            Written discharge instructions were provided to the                            patient.                           - Resume previous diet.                           - Continue present medications. Jerene Bears, MD 10/07/2019 11:04:09 AM This report has been signed electronically.

## 2019-10-07 NOTE — Progress Notes (Signed)
A and O x3. Report to RN. Tolerated MAC anesthesia well.Teeth unchanged after procedure.

## 2019-10-07 NOTE — Progress Notes (Signed)
robinol antisialogogue  Lidocaine   buffer 

## 2019-10-07 NOTE — Patient Instructions (Signed)
Handouts given:  Hemorrhoids, Diverticulosis, polyps Resume previous diet Await pathology  Results Repeat colonoscopy for surveillance    YOU HAD AN ENDOSCOPIC PROCEDURE TODAY AT [facility]:   Refer to the procedure report that was given to you for any specific questions about what was found during the examination.  If the procedure report does not answer your questions, please call your gastroenterologist to clarify.  If you requested that your care partner not be given the details of your procedure findings, then the procedure report has been included in a sealed envelope for you to review at your convenience later.  YOU SHOULD EXPECT: Some feelings of bloating in the abdomen. Passage of more gas than usual.  Walking can help get rid of the air that was put into your GI tract during the procedure and reduce the bloating. If you had a lower endoscopy (such as a colonoscopy or flexible sigmoidoscopy) you may notice spotting of blood in your stool or on the toilet paper. If you underwent a bowel prep for your procedure, you may not have a normal bowel movement for a few days.  Please Note:  You might notice some irritation and congestion in your nose or some drainage.  This is from the oxygen used during your procedure.  There is no need for concern and it should clear up in a day or so.  SYMPTOMS TO REPORT IMMEDIATELY:   Following lower endoscopy (colonoscopy or flexible sigmoidoscopy):  Excessive amounts of blood in the stool  Significant tenderness or worsening of abdominal pains  Swelling of the abdomen that is new, acute  Fever of 100F or higher   Following upper endoscopy (EGD)  Vomiting of blood or coffee ground material  New chest pain or pain under the shoulder blades  Painful or persistently difficult swallowing  New shortness of breath  Fever of 100F or higher  Black, tarry-looking stools  For urgent or emergent issues, a gastroenterologist can be reached  at any hour by calling [phone]. Do not use MyChart messaging for urgent concerns.    DIET:  We do recommend a small meal at first, but then you may proceed to your regular diet.  Drink plenty of fluids but you should avoid alcoholic beverages for 24 hours.  ACTIVITY:  You should plan to take it easy for the rest of today and you should NOT DRIVE or use heavy machinery until tomorrow (because of the sedation medicines used during the test).    FOLLOW UP: Our staff will call the number listed on your records 48-72 hours following your procedure to check on you and address any questions or concerns that you may have regarding the information given to you following your procedure. If we do not reach you, we will leave a message.  We will attempt to reach you two times.  During this call, we will ask if you have developed any symptoms of COVID 19. If you develop any symptoms (ie: fever, flu-like symptoms, shortness of breath, cough etc.) before then, please call [phone].  If you test positive for Covid 19 in the 2 weeks post procedure, please call and report this information to [place].    If any biopsies were taken you will be contacted by phone or by letter within the next 1-3 weeks.  Please call us at [phone] if you have not heard about the biopsies in 3 weeks.    SIGNATURES/CONFIDENTIALITY: You and/or your care partner have signed paperwork which will  be entered into your electronic medical record.  These signatures attest to the fact that that the information above on your After Visit Summary has been reviewed and is understood.  Full responsibility of the confidentiality of this discharge information lies with you and/or your care-partner. 

## 2019-10-07 NOTE — Op Note (Signed)
[place] Patient Name: Albert [name] Procedure Date: 10/07/2019 10:30 AM MRN: [id number] Endoscopist: Jerene Bears , MD Age: 64 Referring MD:  Date of Birth: 16-Aug-1955 Gender: Male Account #: [id number] Procedure:                Colonoscopy Indications:              High risk colon cancer surveillance: Personal                            history of multiple (3) adenomas, Last colonoscopy:                            May 2018 Medicines:                Monitored Anesthesia Care Procedure:                Pre-Anesthesia Assessment:                           - Prior to the procedure, a History and Physical                            was performed, and patient medications and                            allergies were reviewed. The patient's tolerance of                            previous anesthesia was also reviewed. The risks                            and benefits of the procedure and the sedation                            options and risks were discussed with the patient.                            All questions were answered, and informed consent                            was obtained. Prior Anticoagulants: The patient has                            taken no previous anticoagulant or antiplatelet                            agents. ASA Grade Assessment: II - A patient with                            mild systemic disease. After reviewing the risks                            and benefits, the patient was deemed in  satisfactory condition to undergo the procedure.                           After obtaining informed consent, the colonoscope                            was passed under direct vision. Throughout the                            procedure, the patient's blood pressure, pulse, and                            oxygen saturations were monitored continuously. The                            Colonoscope was introduced through the anus and                             advanced to the cecum, identified by appendiceal                            orifice and ileocecal valve. The colonoscopy was                            performed without difficulty. The patient tolerated                            the procedure well. The quality of the bowel                            preparation was good. The ileocecal valve,                            appendiceal orifice, and rectum were photographed. Scope In: 10:47:45 AM Scope Out: 11:00:45 AM Scope Withdrawal Time: 0 hours 10 minutes 15 seconds  Total Procedure Duration: 0 hours 13 minutes 0 seconds  Findings:                 The digital rectal exam was normal.                           A 4 mm polyp was found in the transverse colon. The                            polyp was sessile. The polyp was removed with a                            cold snare. Resection and retrieval were complete.                           A 4 mm polyp was found in the descending colon. The                            polyp was  sessile. The polyp was removed with a                            cold snare. Resection and retrieval were complete.                           A few small-mouthed diverticula were found in the                            sigmoid colon.                           Internal hemorrhoids were found during                            retroflexion. The hemorrhoids were small. Complications:            No immediate complications. Estimated Blood Loss:     Estimated blood loss was minimal. Impression:               - One 4 mm polyp in the transverse colon, removed                            with a cold snare. Resected and retrieved.                           - One 4 mm polyp in the descending colon, removed                            with a cold snare. Resected and retrieved.                           - Diverticulosis in the sigmoid colon.                           - Small internal hemorrhoids. Recommendation:            - Patient has a contact number available for                            emergencies. The signs and symptoms of potential                            delayed complications were discussed with the                            patient. Return to normal activities tomorrow.                            Written discharge instructions were provided to the                            patient.                           - Resume previous diet.                           -  Continue present medications.                           - Await pathology results.                           - Repeat colonoscopy is recommended for                            surveillance. The colonoscopy date will be                            determined after pathology results from today's                            exam become available for review. Jerene Bears, MD 10/07/2019 11:06:23 AM This report has been signed electronically.

## 2019-10-07 NOTE — Progress Notes (Signed)
Called to room to assist during endoscopic procedure.  Patient ID and intended procedure confirmed with present staff. Received instructions for my participation in the procedure from the performing physician.  

## 2019-10-11 ENCOUNTER — Telehealth: Payer: Self-pay

## 2019-10-11 NOTE — Telephone Encounter (Signed)
  Follow up Call-  Call back number 10/07/2019  Post procedure Call Back phone  # [phone]  Permission to leave phone message Yes  Some recent data might be hidden     Patient questions:  Do you have a fever, pain , or abdominal swelling? No. Pain Score  0 *  Have you tolerated food without any problems? Yes.    Have you been able to return to your normal activities? Yes.    Do you have any questions about your discharge instructions: Diet   No. Medications  No. Follow up visit  No.  Do you have questions or concerns about your Care? No.  Actions: * If pain score is 4 or above: No action needed, pain <4.  Have you developed a fever since your procedure? No 2.   Have you had an respiratory symptoms (SOB or cough) since your procedure? No  3.   Have you tested positive for COVID 19 since your procedure No  4.   Have you had any family members/close contacts diagnosed with the COVID 19 since your procedure?  No If yes to any of these questions please route to Joylene John, RN and Erenest Rasher, RN

## 2019-10-16 ENCOUNTER — Encounter: Payer: Self-pay | Admitting: Internal Medicine

## 2019-11-30 DIAGNOSIS — Z23 Encounter for immunization: Secondary | ICD-10-CM | POA: Diagnosis not present

## 2020-01-26 DIAGNOSIS — H5789 Other specified disorders of eye and adnexa: Secondary | ICD-10-CM | POA: Diagnosis not present

## 2020-01-26 MED FILL — MELOXICAM 15 MG TABLET: 15 | 90 days supply | Qty: 90 | Fill #1

## 2020-01-26 MED FILL — PRAMOSONE 1-2.5 % OINT: 1-2.5 | 30 days supply | Qty: 57 | Fill #1

## 2020-01-26 MED FILL — ESOMEPRAZOLE MAG DR 40 MG C: 40 | 90 days supply | Qty: 90 | Fill #1

## 2020-05-04 MED FILL — MELOXICAM 15 MG TABLET: 15 | 90 days supply | Qty: 90 | Fill #2

## 2020-05-04 MED FILL — PRAMOSONE 1-2.5 % OINT: 1-2.5 | 30 days supply | Qty: 57 | Fill #2

## 2020-05-04 MED FILL — ESOMEPRAZOLE MAG DR 40 MG C: 40 | 90 days supply | Qty: 90 | Fill #2

## 2020-06-14 ENCOUNTER — Other Ambulatory Visit (HOSPITAL_BASED_OUTPATIENT_CLINIC_OR_DEPARTMENT_OTHER): Payer: Self-pay

## 2020-07-02 ENCOUNTER — Other Ambulatory Visit (HOSPITAL_COMMUNITY): Payer: Self-pay

## 2020-07-02 MED ORDER — AMOXICILLIN 500 MG PO CAPS
500.0000 mg | ORAL_CAPSULE | Freq: Three times a day (TID) | ORAL | 0 refills | Status: DC
  Filled 2020-07-02: qty 30, 10d supply, fill #0

## 2020-07-03 ENCOUNTER — Other Ambulatory Visit (HOSPITAL_COMMUNITY): Payer: Self-pay

## 2020-07-04 ENCOUNTER — Other Ambulatory Visit (HOSPITAL_COMMUNITY): Payer: Self-pay

## 2020-08-14 ENCOUNTER — Other Ambulatory Visit (HOSPITAL_COMMUNITY): Payer: Self-pay

## 2020-08-14 MED FILL — Pramoxine-HC Oint 1-2.5%: CUTANEOUS | 30 days supply | Qty: 56.8 | Fill #0 | Status: AC

## 2020-08-14 MED FILL — Esomeprazole Magnesium Cap Delayed Release 40 MG (Base Eq): ORAL | 90 days supply | Qty: 90 | Fill #0 | Status: AC

## 2020-08-15 ENCOUNTER — Other Ambulatory Visit (HOSPITAL_COMMUNITY): Payer: Self-pay

## 2020-09-04 ENCOUNTER — Other Ambulatory Visit (HOSPITAL_COMMUNITY): Payer: Self-pay

## 2020-09-04 DIAGNOSIS — M129 Arthropathy, unspecified: Secondary | ICD-10-CM | POA: Diagnosis not present

## 2020-09-04 DIAGNOSIS — E782 Mixed hyperlipidemia: Secondary | ICD-10-CM | POA: Diagnosis not present

## 2020-09-04 DIAGNOSIS — K219 Gastro-esophageal reflux disease without esophagitis: Secondary | ICD-10-CM | POA: Diagnosis not present

## 2020-09-04 DIAGNOSIS — G8929 Other chronic pain: Secondary | ICD-10-CM | POA: Diagnosis not present

## 2020-09-04 DIAGNOSIS — M545 Low back pain, unspecified: Secondary | ICD-10-CM | POA: Diagnosis not present

## 2020-09-04 DIAGNOSIS — Z125 Encounter for screening for malignant neoplasm of prostate: Secondary | ICD-10-CM | POA: Diagnosis not present

## 2020-09-04 MED ORDER — MELOXICAM 15 MG PO TABS
ORAL_TABLET | ORAL | 3 refills | Status: DC
  Filled 2020-09-04: qty 90, 90d supply, fill #0
  Filled 2021-03-04: qty 90, 90d supply, fill #1
  Filled 2021-07-01: qty 90, 90d supply, fill #2

## 2020-09-04 MED ORDER — ESOMEPRAZOLE MAGNESIUM 40 MG PO CPDR
DELAYED_RELEASE_CAPSULE | ORAL | 3 refills | Status: DC
  Filled 2021-03-04: qty 90, 90d supply, fill #0
  Filled 2021-07-01: qty 90, 90d supply, fill #1

## 2020-09-04 MED ORDER — CYCLOBENZAPRINE HCL 5 MG PO TABS
ORAL_TABLET | ORAL | 3 refills | Status: AC
  Filled 2020-09-04: qty 60, 20d supply, fill #0

## 2020-09-04 MED ORDER — FLUTICASONE PROPIONATE 50 MCG/ACT NA SUSP
NASAL | 3 refills | Status: DC
  Filled 2020-09-04: qty 16, 30d supply, fill #0

## 2020-09-04 MED ORDER — PRAMOSONE 1-2.5 % EX OINT
TOPICAL_OINTMENT | CUTANEOUS | 5 refills | Status: DC
  Filled 2020-09-04: qty 56.8, 30d supply, fill #0
  Filled 2021-03-04: qty 56.8, 30d supply, fill #1
  Filled 2021-07-01: qty 56.8, 30d supply, fill #2

## 2020-09-07 ENCOUNTER — Other Ambulatory Visit (HOSPITAL_COMMUNITY): Payer: Self-pay

## 2020-09-10 ENCOUNTER — Other Ambulatory Visit (HOSPITAL_COMMUNITY): Payer: Self-pay

## 2020-11-09 DIAGNOSIS — H8301 Labyrinthitis, right ear: Secondary | ICD-10-CM | POA: Diagnosis not present

## 2020-11-21 DIAGNOSIS — K6289 Other specified diseases of anus and rectum: Secondary | ICD-10-CM | POA: Diagnosis not present

## 2020-12-06 ENCOUNTER — Encounter (HOSPITAL_BASED_OUTPATIENT_CLINIC_OR_DEPARTMENT_OTHER): Payer: Self-pay | Admitting: *Deleted

## 2020-12-06 ENCOUNTER — Emergency Department (HOSPITAL_BASED_OUTPATIENT_CLINIC_OR_DEPARTMENT_OTHER)
Admission: EM | Admit: 2020-12-06 | Discharge: 2020-12-06 | Disposition: A | Discharge status: Home / Self Care | Payer: 59 | Attending: Emergency Medicine | Admitting: Emergency Medicine

## 2020-12-06 ENCOUNTER — Other Ambulatory Visit: Payer: Self-pay

## 2020-12-06 DIAGNOSIS — M62838 Other muscle spasm: Secondary | ICD-10-CM | POA: Insufficient documentation

## 2020-12-06 DIAGNOSIS — Y92410 Unspecified street and highway as the place of occurrence of the external cause: Secondary | ICD-10-CM | POA: Insufficient documentation

## 2020-12-06 DIAGNOSIS — M542 Cervicalgia: Secondary | ICD-10-CM | POA: Diagnosis present

## 2020-12-06 DIAGNOSIS — F17210 Nicotine dependence, cigarettes, uncomplicated: Secondary | ICD-10-CM | POA: Diagnosis not present
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
## 2020-12-06 NOTE — ED Notes (Signed)
ED Provider at bedside. 
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
## 2020-12-25 DIAGNOSIS — M62830 Muscle spasm of back: Secondary | ICD-10-CM | POA: Diagnosis not present

## 2020-12-25 DIAGNOSIS — M5411 Radiculopathy, occipito-atlanto-axial region: Secondary | ICD-10-CM | POA: Diagnosis not present

## 2020-12-25 DIAGNOSIS — S134XXA Sprain of ligaments of cervical spine, initial encounter: Secondary | ICD-10-CM | POA: Diagnosis not present

## 2020-12-26 DIAGNOSIS — M5411 Radiculopathy, occipito-atlanto-axial region: Secondary | ICD-10-CM | POA: Diagnosis not present

## 2020-12-26 DIAGNOSIS — M62830 Muscle spasm of back: Secondary | ICD-10-CM | POA: Diagnosis not present

## 2020-12-26 DIAGNOSIS — S134XXA Sprain of ligaments of cervical spine, initial encounter: Secondary | ICD-10-CM | POA: Diagnosis not present

## 2020-12-27 DIAGNOSIS — S134XXA Sprain of ligaments of cervical spine, initial encounter: Secondary | ICD-10-CM | POA: Diagnosis not present

## 2020-12-27 DIAGNOSIS — M62830 Muscle spasm of back: Secondary | ICD-10-CM | POA: Diagnosis not present

## 2020-12-27 DIAGNOSIS — M5411 Radiculopathy, occipito-atlanto-axial region: Secondary | ICD-10-CM | POA: Diagnosis not present

## 2020-12-31 DIAGNOSIS — M62830 Muscle spasm of back: Secondary | ICD-10-CM | POA: Diagnosis not present

## 2020-12-31 DIAGNOSIS — S134XXA Sprain of ligaments of cervical spine, initial encounter: Secondary | ICD-10-CM | POA: Diagnosis not present

## 2020-12-31 DIAGNOSIS — M5411 Radiculopathy, occipito-atlanto-axial region: Secondary | ICD-10-CM | POA: Diagnosis not present

## 2021-01-02 DIAGNOSIS — M5411 Radiculopathy, occipito-atlanto-axial region: Secondary | ICD-10-CM | POA: Diagnosis not present

## 2021-01-02 DIAGNOSIS — M62830 Muscle spasm of back: Secondary | ICD-10-CM | POA: Diagnosis not present

## 2021-01-02 DIAGNOSIS — S134XXA Sprain of ligaments of cervical spine, initial encounter: Secondary | ICD-10-CM | POA: Diagnosis not present

## 2021-01-03 DIAGNOSIS — M62830 Muscle spasm of back: Secondary | ICD-10-CM | POA: Diagnosis not present

## 2021-01-03 DIAGNOSIS — M5411 Radiculopathy, occipito-atlanto-axial region: Secondary | ICD-10-CM | POA: Diagnosis not present

## 2021-01-03 DIAGNOSIS — S134XXA Sprain of ligaments of cervical spine, initial encounter: Secondary | ICD-10-CM | POA: Diagnosis not present

## 2021-01-07 DIAGNOSIS — M62830 Muscle spasm of back: Secondary | ICD-10-CM | POA: Diagnosis not present

## 2021-01-07 DIAGNOSIS — S134XXA Sprain of ligaments of cervical spine, initial encounter: Secondary | ICD-10-CM | POA: Diagnosis not present

## 2021-01-07 DIAGNOSIS — M5411 Radiculopathy, occipito-atlanto-axial region: Secondary | ICD-10-CM | POA: Diagnosis not present

## 2021-01-09 DIAGNOSIS — S134XXA Sprain of ligaments of cervical spine, initial encounter: Secondary | ICD-10-CM | POA: Diagnosis not present

## 2021-01-09 DIAGNOSIS — M62830 Muscle spasm of back: Secondary | ICD-10-CM | POA: Diagnosis not present

## 2021-01-09 DIAGNOSIS — M5411 Radiculopathy, occipito-atlanto-axial region: Secondary | ICD-10-CM | POA: Diagnosis not present

## 2021-01-14 DIAGNOSIS — S134XXA Sprain of ligaments of cervical spine, initial encounter: Secondary | ICD-10-CM | POA: Diagnosis not present

## 2021-01-14 DIAGNOSIS — M5411 Radiculopathy, occipito-atlanto-axial region: Secondary | ICD-10-CM | POA: Diagnosis not present

## 2021-01-14 DIAGNOSIS — M62830 Muscle spasm of back: Secondary | ICD-10-CM | POA: Diagnosis not present

## 2021-01-17 DIAGNOSIS — M62830 Muscle spasm of back: Secondary | ICD-10-CM | POA: Diagnosis not present

## 2021-01-17 DIAGNOSIS — M5411 Radiculopathy, occipito-atlanto-axial region: Secondary | ICD-10-CM | POA: Diagnosis not present

## 2021-01-17 DIAGNOSIS — S134XXA Sprain of ligaments of cervical spine, initial encounter: Secondary | ICD-10-CM | POA: Diagnosis not present

## 2021-01-22 DIAGNOSIS — M62830 Muscle spasm of back: Secondary | ICD-10-CM | POA: Diagnosis not present

## 2021-01-22 DIAGNOSIS — M5411 Radiculopathy, occipito-atlanto-axial region: Secondary | ICD-10-CM | POA: Diagnosis not present

## 2021-01-22 DIAGNOSIS — S134XXA Sprain of ligaments of cervical spine, initial encounter: Secondary | ICD-10-CM | POA: Diagnosis not present

## 2021-01-28 DIAGNOSIS — M5411 Radiculopathy, occipito-atlanto-axial region: Secondary | ICD-10-CM | POA: Diagnosis not present

## 2021-01-28 DIAGNOSIS — M62830 Muscle spasm of back: Secondary | ICD-10-CM | POA: Diagnosis not present

## 2021-01-28 DIAGNOSIS — S134XXA Sprain of ligaments of cervical spine, initial encounter: Secondary | ICD-10-CM | POA: Diagnosis not present

## 2021-01-30 DIAGNOSIS — M5411 Radiculopathy, occipito-atlanto-axial region: Secondary | ICD-10-CM | POA: Diagnosis not present

## 2021-01-30 DIAGNOSIS — M62830 Muscle spasm of back: Secondary | ICD-10-CM | POA: Diagnosis not present

## 2021-01-30 DIAGNOSIS — S134XXA Sprain of ligaments of cervical spine, initial encounter: Secondary | ICD-10-CM | POA: Diagnosis not present

## 2021-02-05 DIAGNOSIS — S134XXA Sprain of ligaments of cervical spine, initial encounter: Secondary | ICD-10-CM | POA: Diagnosis not present

## 2021-02-05 DIAGNOSIS — M62830 Muscle spasm of back: Secondary | ICD-10-CM | POA: Diagnosis not present

## 2021-02-05 DIAGNOSIS — M5411 Radiculopathy, occipito-atlanto-axial region: Secondary | ICD-10-CM | POA: Diagnosis not present

## 2021-02-12 DIAGNOSIS — S134XXA Sprain of ligaments of cervical spine, initial encounter: Secondary | ICD-10-CM | POA: Diagnosis not present

## 2021-02-12 DIAGNOSIS — M5411 Radiculopathy, occipito-atlanto-axial region: Secondary | ICD-10-CM | POA: Diagnosis not present

## 2021-02-12 DIAGNOSIS — M62830 Muscle spasm of back: Secondary | ICD-10-CM | POA: Diagnosis not present

## 2021-02-19 DIAGNOSIS — S134XXA Sprain of ligaments of cervical spine, initial encounter: Secondary | ICD-10-CM | POA: Diagnosis not present

## 2021-02-19 DIAGNOSIS — M62830 Muscle spasm of back: Secondary | ICD-10-CM | POA: Diagnosis not present

## 2021-02-19 DIAGNOSIS — M5411 Radiculopathy, occipito-atlanto-axial region: Secondary | ICD-10-CM | POA: Diagnosis not present

## 2021-02-26 DIAGNOSIS — S134XXA Sprain of ligaments of cervical spine, initial encounter: Secondary | ICD-10-CM | POA: Diagnosis not present

## 2021-02-26 DIAGNOSIS — M62830 Muscle spasm of back: Secondary | ICD-10-CM | POA: Diagnosis not present

## 2021-02-26 DIAGNOSIS — M5411 Radiculopathy, occipito-atlanto-axial region: Secondary | ICD-10-CM | POA: Diagnosis not present

## 2021-03-04 ENCOUNTER — Other Ambulatory Visit (HOSPITAL_COMMUNITY): Payer: Self-pay

## 2021-03-05 ENCOUNTER — Other Ambulatory Visit (HOSPITAL_COMMUNITY): Payer: Self-pay

## 2021-03-05 DIAGNOSIS — M62830 Muscle spasm of back: Secondary | ICD-10-CM | POA: Diagnosis not present

## 2021-03-05 DIAGNOSIS — S134XXA Sprain of ligaments of cervical spine, initial encounter: Secondary | ICD-10-CM | POA: Diagnosis not present

## 2021-03-05 DIAGNOSIS — M5411 Radiculopathy, occipito-atlanto-axial region: Secondary | ICD-10-CM | POA: Diagnosis not present

## 2021-03-12 DIAGNOSIS — M5411 Radiculopathy, occipito-atlanto-axial region: Secondary | ICD-10-CM | POA: Diagnosis not present

## 2021-03-12 DIAGNOSIS — S134XXA Sprain of ligaments of cervical spine, initial encounter: Secondary | ICD-10-CM | POA: Diagnosis not present

## 2021-03-12 DIAGNOSIS — M62830 Muscle spasm of back: Secondary | ICD-10-CM | POA: Diagnosis not present

## 2021-03-27 DIAGNOSIS — M62830 Muscle spasm of back: Secondary | ICD-10-CM | POA: Diagnosis not present

## 2021-03-27 DIAGNOSIS — S134XXA Sprain of ligaments of cervical spine, initial encounter: Secondary | ICD-10-CM | POA: Diagnosis not present

## 2021-03-27 DIAGNOSIS — M5411 Radiculopathy, occipito-atlanto-axial region: Secondary | ICD-10-CM | POA: Diagnosis not present

## 2021-04-02 DIAGNOSIS — S134XXA Sprain of ligaments of cervical spine, initial encounter: Secondary | ICD-10-CM | POA: Diagnosis not present

## 2021-04-02 DIAGNOSIS — M62830 Muscle spasm of back: Secondary | ICD-10-CM | POA: Diagnosis not present

## 2021-04-02 DIAGNOSIS — M5411 Radiculopathy, occipito-atlanto-axial region: Secondary | ICD-10-CM | POA: Diagnosis not present

## 2021-04-07 ENCOUNTER — Emergency Department (HOSPITAL_COMMUNITY): Payer: 59

## 2021-04-07 ENCOUNTER — Encounter (HOSPITAL_COMMUNITY): Payer: Self-pay | Admitting: Radiology

## 2021-04-07 ENCOUNTER — Emergency Department (HOSPITAL_COMMUNITY)
Admission: EM | Admit: 2021-04-07 | Discharge: 2021-04-07 | Disposition: A | Discharge status: Home / Self Care | Payer: 59 | Attending: Emergency Medicine | Admitting: Emergency Medicine

## 2021-04-07 DIAGNOSIS — R1031 Right lower quadrant pain: Secondary | ICD-10-CM | POA: Diagnosis present

## 2021-04-07 DIAGNOSIS — N201 Calculus of ureter: Secondary | ICD-10-CM | POA: Diagnosis not present

## 2021-04-07 DIAGNOSIS — Z7951 Long term (current) use of inhaled steroids: Secondary | ICD-10-CM | POA: Diagnosis not present

## 2021-04-07 DIAGNOSIS — N281 Cyst of kidney, acquired: Secondary | ICD-10-CM | POA: Diagnosis not present

## 2021-04-07 DIAGNOSIS — N132 Hydronephrosis with renal and ureteral calculous obstruction: Secondary | ICD-10-CM | POA: Insufficient documentation

## 2021-04-07 LAB — CBC
HCT: 47.1 % (ref 39.0–52.0)
Hemoglobin: 15.8 g/dL (ref 13.0–17.0)
MCH: 32 pg (ref 26.0–34.0)
MCHC: 33.5 g/dL (ref 30.0–36.0)
MCV: 95.5 fL (ref 80.0–100.0)
Platelets: 269 10*3/uL (ref 150–400)
RBC: 4.93 MIL/uL (ref 4.22–5.81)
RDW: 13.3 % (ref 11.5–15.5)
WBC: 11.8 10*3/uL — ABNORMAL HIGH (ref 4.0–10.5)
nRBC: 0 % (ref 0.0–0.2)

## 2021-04-07 LAB — URINALYSIS, ROUTINE W REFLEX MICROSCOPIC
Bilirubin Urine: NEGATIVE
Glucose, UA: NEGATIVE mg/dL
Ketones, ur: NEGATIVE mg/dL
Leukocytes,Ua: NEGATIVE
Nitrite: NEGATIVE
Protein, ur: 30 mg/dL — AB
RBC / HPF: >50 RBC/hpf — ABNORMAL HIGH (ref 0–5)
Specific Gravity, Urine: 1.02 (ref 1.005–1.030)
pH: 5 (ref 5.0–8.0)

## 2021-04-07 LAB — BASIC METABOLIC PANEL
Anion gap: 3 — ABNORMAL LOW (ref 5–15)
BUN: 26 mg/dL — ABNORMAL HIGH (ref 8–23)
CO2: 25 mmol/L (ref 22–32)
Calcium: 9.8 mg/dL (ref 8.9–10.3)
Chloride: 108 mmol/L (ref 98–111)
Creatinine, Ser: 0.89 mg/dL (ref 0.61–1.24)
GFR, Estimated: >60 mL/min (ref >60)
Glucose, Bld: 159 mg/dL — ABNORMAL HIGH (ref 70–99)
Potassium: 4.4 mmol/L (ref 3.5–5.1)
Sodium: 136 mmol/L (ref 135–145)

## 2021-04-07 MED ORDER — KETOROLAC TROMETHAMINE 30 MG/ML IJ SOLN
30.0000 mg | Freq: Once | INTRAMUSCULAR | Status: AC
  Administered 2021-04-07: 30 mg via INTRAVENOUS
  Filled 2021-04-07: qty 1

## 2021-04-07 MED ORDER — TAMSULOSIN HCL 0.4 MG PO CAPS: 0.4000 mg | ORAL_CAPSULE | Freq: Every day | ORAL | 0 refills | Status: DC

## 2021-04-07 MED ORDER — ONDANSETRON 4 MG PO TBDP: 4.0000 mg | ORAL_TABLET | Freq: Three times a day (TID) | ORAL | 0 refills | Status: AC | PRN

## 2021-04-07 MED ORDER — SODIUM CHLORIDE 0.9 % IV SOLN: INTRAVENOUS | Status: DC

## 2021-04-07 MED ORDER — OXYCODONE-ACETAMINOPHEN 5-325 MG PO TABS: 1.0000 | ORAL_TABLET | Freq: Four times a day (QID) | ORAL | 0 refills | Status: DC | PRN

## 2021-04-07 MED ORDER — SODIUM CHLORIDE 0.9 % IV BOLUS
1000.0000 mL | Freq: Once | INTRAVENOUS | Status: AC
  Administered 2021-04-07: 1000 mL via INTRAVENOUS

## 2021-04-07 MED ORDER — ONDANSETRON HCL 4 MG/2ML IJ SOLN
4.0000 mg | Freq: Once | INTRAMUSCULAR | Status: AC
  Administered 2021-04-07: 4 mg via INTRAVENOUS
  Filled 2021-04-07: qty 2

## 2021-04-07 NOTE — ED Triage Notes (Signed)
Patient c/o R flank pain radiating to groin since last night. Hx kidney stones.

## 2021-04-07 NOTE — ED Provider Notes (Signed)
[facility] DEPT Provider Note   CSN: [id number] Arrival date & time: 04/07/21  3664     History  Chief Complaint  Patient presents with   Flank Pain    Albert [name] is a 66 y.o. male.  Pt is a 66 yo wm with a hx of kidney stones, gerd, and hyperlipidemia.  He presents to the ED today with right sided flank pain.  It feels like a kidney stone.  He has not had one in several years.  Pain started around 0300 along with n/v.  Pt is very uncomfortable.      Home Medications Prior to Admission medications   Medication Sig Start Date End Date Taking? Authorizing Provider  ondansetron (ZOFRAN-ODT) 4 MG disintegrating tablet Take 1 tablet (4 mg total) by mouth every 8 (eight) hours as needed for nausea or vomiting. 04/07/21  Yes Isla Pence, MD  oxyCODONE-acetaminophen (PERCOCET/ROXICET) 5-325 MG tablet Take 1 tablet by mouth every 6 (six) hours as needed for severe pain. 04/07/21  Yes Isla Pence, MD  tamsulosin (FLOMAX) 0.4 MG CAPS capsule Take 1 capsule (0.4 mg total) by mouth daily. 04/07/21  Yes Isla Pence, MD  cyclobenzaprine (FLEXERIL) 5 MG tablet Take 1 tablet by mouth 3 times a day as needed for muscle spasm 09/04/20     esomeprazole (NEXIUM) 40 MG capsule TAKE 1 CAPSULE BY MOUTH DAILY TO CONTROL ACID 09/04/20     fluticasone (FLONASE) 50 MCG/ACT nasal spray Place 1 to 2 sprays into each nostril daily as needed for allergies 09/04/20     meloxicam (MOBIC) 15 MG tablet Take 1/2 to 1 tablet by mouth daily with food as needed for arthritis paih 09/04/20     Pramoxine-HC (PRAMOSONE) 1-2.5 % OINT Apply twice a day as needed for hemorrhoids 09/04/20     sildenafil (VIAGRA) 100 MG tablet Take 1/2 to one as needed for ED Patient not taking: Reported on 10/07/2019 08/30/19   [provider]      Allergies    Patient has no known allergies.    Review of Systems   Review of Systems  Gastrointestinal:  Positive for nausea and vomiting.   Genitourinary:  Positive for flank pain.  All other systems reviewed and are negative.  Physical Exam Updated Vital Signs BP 124/69    Pulse 77    Temp 97.7 F (36.5 C) (Oral)    Resp 18    SpO2 98%  Physical Exam Vitals and nursing note reviewed.  Constitutional:      Appearance: Normal appearance. He is ill-appearing.  HENT:     Head: Normocephalic and atraumatic.     Right Ear: External ear normal.     Left Ear: External ear normal.     Nose: Nose normal.     Mouth/Throat:     Mouth: Mucous membranes are dry.     Pharynx: Oropharynx is clear.  Eyes:     Extraocular Movements: Extraocular movements intact.     Conjunctiva/sclera: Conjunctivae normal.     Pupils: Pupils are equal, round, and reactive to light.  Cardiovascular:     Rate and Rhythm: Normal rate and regular rhythm.     Pulses: Normal pulses.     Heart sounds: Normal heart sounds.  Pulmonary:     Effort: Pulmonary effort is normal.     Breath sounds: Normal breath sounds.  Abdominal:     General: Abdomen is flat. Bowel sounds are normal.     Palpations: Abdomen is  soft.  Musculoskeletal:        General: Normal range of motion.     Cervical back: Normal range of motion and neck supple.  Skin:    General: Skin is warm.     Capillary Refill: Capillary refill takes less than 2 seconds.  Neurological:     General: No focal deficit present.     Mental Status: He is alert and oriented to person, place, and time.  Psychiatric:        Mood and Affect: Mood normal.        Behavior: Behavior normal.        Thought Content: Thought content normal.        Judgment: Judgment normal.    ED Results / Procedures / Treatments   Labs (all labs ordered are listed, but only abnormal results are displayed) Labs Reviewed  BASIC METABOLIC PANEL - Abnormal; Notable for the following components:      Result Value   Glucose, Bld 159 (*)    BUN 26 (*)    Anion gap 3 (*)    All other components within normal limits  CBC  - Abnormal; Notable for the following components:   WBC 11.8 (*)    All other components within normal limits  URINALYSIS, ROUTINE W REFLEX MICROSCOPIC - Abnormal; Notable for the following components:   APPearance HAZY (*)    Hgb urine dipstick LARGE (*)    Protein, ur 30 (*)    RBC / HPF >50 (*)    Bacteria, UA RARE (*)    All other components within normal limits    EKG None  Radiology CT RENAL STONE STUDY  Result Date: 04/07/2021 CLINICAL DATA:  Flank pain with kidney stone suspected on the right. EXAM: CT ABDOMEN AND PELVIS WITHOUT CONTRAST TECHNIQUE: Multidetector CT imaging of the abdomen and pelvis was performed following the standard protocol without IV contrast. RADIATION DOSE REDUCTION: This exam was performed according to the departmental dose-optimization program which includes automated exposure control, adjustment of the mA and/or kV according to patient size and/or use of iterative reconstruction technique. COMPARISON:  05/22/2003 FINDINGS: Lower chest:  No contributory findings. Hepatobiliary: No focal liver abnormality.No evidence of biliary obstruction or stone. Pancreas: Unremarkable. Spleen: Unremarkable. Adrenals/Urinary Tract: Negative adrenals. Two lower right ureteral calculi both measuring up to 4 mm. Right mild hydroureteronephrosis and low-density renal expansion. At least 3 right and single left renal calculi, measuring up to 6 mm at the upper pole left kidney. 3.6 cm interpolar cyst in the left kidney. Collapsed bladder Stomach/Bowel:  No obstruction. Negative for bowel inflammation. Vascular/Lymphatic: No acute vascular abnormality. Scattered atheromatous calcifications. No mass or adenopathy. Reproductive:No pathologic findings. Other: No ascites or pneumoperitoneum. Musculoskeletal: No acute abnormalities. IMPRESSION: 1. Right hydroureteronephrosis from 2 distal right ureteral calculi both measuring 4 mm. 2. Bilateral renal calculi. Electronically Signed   By:  Jorje Guild M.D.   On: 04/07/2021 07:54    Procedures Procedures    Medications Ordered in ED Medications  sodium chloride 0.9 % bolus 1,000 mL (1,000 mLs Intravenous New Bag/Given (Non-Interop) 04/07/21 0731)    And  0.9 %  sodium chloride infusion ( Intravenous New Bag/Given (Non-Interop) 04/07/21 0839)  ketorolac (TORADOL) 30 MG/ML injection 30 mg (30 mg Intravenous Given 04/07/21 0731)  ondansetron (ZOFRAN) injection 4 mg (4 mg Intravenous Given 04/07/21 0731)    ED Course/ Medical Decision Making/ A&P  Medical Decision Making  Pt has a hx of kidney stones with symptoms c/w kidney stones.  However, he has not had one in many years.  Due to this, I did a CT scan which does reveal 2 right ureteral stone that are 4 mm.  Labs show hematuria c/w kidney stones.    Pain is gone now.  He feels much better.  He is d/c with percocet, zofran, and flomax.  Pt knows to return if worse.  He is to f/u with urology.        Final Clinical Impression(s) / ED Diagnoses Final diagnoses:  Ureterolithiasis    Rx / DC Orders ED Discharge Orders          Ordered    oxyCODONE-acetaminophen (PERCOCET/ROXICET) 5-325 MG tablet  Every 6 hours PRN        04/07/21 0927    ondansetron (ZOFRAN-ODT) 4 MG disintegrating tablet  Every 8 hours PRN        04/07/21 0927    tamsulosin (FLOMAX) 0.4 MG CAPS capsule  Daily        04/07/21 0927              Isla Pence, MD 04/07/21 0930

## 2021-04-09 DIAGNOSIS — M5411 Radiculopathy, occipito-atlanto-axial region: Secondary | ICD-10-CM | POA: Diagnosis not present

## 2021-04-09 DIAGNOSIS — M62830 Muscle spasm of back: Secondary | ICD-10-CM | POA: Diagnosis not present

## 2021-04-09 DIAGNOSIS — S134XXA Sprain of ligaments of cervical spine, initial encounter: Secondary | ICD-10-CM | POA: Diagnosis not present

## 2021-05-14 DIAGNOSIS — M62830 Muscle spasm of back: Secondary | ICD-10-CM | POA: Diagnosis not present

## 2021-05-14 DIAGNOSIS — M5411 Radiculopathy, occipito-atlanto-axial region: Secondary | ICD-10-CM | POA: Diagnosis not present

## 2021-05-14 DIAGNOSIS — S134XXA Sprain of ligaments of cervical spine, initial encounter: Secondary | ICD-10-CM | POA: Diagnosis not present

## 2021-07-02 ENCOUNTER — Other Ambulatory Visit (HOSPITAL_COMMUNITY): Payer: Self-pay

## 2021-07-03 ENCOUNTER — Other Ambulatory Visit (HOSPITAL_COMMUNITY): Payer: Self-pay

## 2021-07-04 DIAGNOSIS — M62830 Muscle spasm of back: Secondary | ICD-10-CM | POA: Diagnosis not present

## 2021-07-04 DIAGNOSIS — S134XXA Sprain of ligaments of cervical spine, initial encounter: Secondary | ICD-10-CM | POA: Diagnosis not present

## 2021-07-04 DIAGNOSIS — M5411 Radiculopathy, occipito-atlanto-axial region: Secondary | ICD-10-CM | POA: Diagnosis not present

## 2021-07-18 ENCOUNTER — Other Ambulatory Visit (HOSPITAL_COMMUNITY): Payer: Self-pay

## 2021-07-18 MED ORDER — AMOXICILLIN 500 MG PO CAPS
ORAL_CAPSULE | ORAL | 0 refills | Status: AC
  Filled 2021-07-18: qty 30, 10d supply, fill #0

## 2021-08-16 ENCOUNTER — Emergency Department (HOSPITAL_BASED_OUTPATIENT_CLINIC_OR_DEPARTMENT_OTHER): Payer: 59

## 2021-08-16 ENCOUNTER — Encounter (HOSPITAL_BASED_OUTPATIENT_CLINIC_OR_DEPARTMENT_OTHER): Payer: Self-pay

## 2021-08-16 ENCOUNTER — Other Ambulatory Visit: Payer: Self-pay

## 2021-08-16 ENCOUNTER — Emergency Department (HOSPITAL_BASED_OUTPATIENT_CLINIC_OR_DEPARTMENT_OTHER)
Admission: EM | Admit: 2021-08-16 | Discharge: 2021-08-16 | Disposition: A | Discharge status: Home / Self Care | Payer: 59 | Attending: Emergency Medicine | Admitting: Emergency Medicine

## 2021-08-16 DIAGNOSIS — R109 Unspecified abdominal pain: Secondary | ICD-10-CM | POA: Diagnosis not present

## 2021-08-16 DIAGNOSIS — N50812 Left testicular pain: Secondary | ICD-10-CM | POA: Diagnosis not present

## 2021-08-16 DIAGNOSIS — N201 Calculus of ureter: Secondary | ICD-10-CM

## 2021-08-16 DIAGNOSIS — I700 Atherosclerosis of aorta: Secondary | ICD-10-CM | POA: Diagnosis not present

## 2021-08-16 DIAGNOSIS — N132 Hydronephrosis with renal and ureteral calculous obstruction: Secondary | ICD-10-CM | POA: Diagnosis not present

## 2021-08-16 DIAGNOSIS — N134 Hydroureter: Secondary | ICD-10-CM | POA: Diagnosis not present

## 2021-08-16 DIAGNOSIS — R1032 Left lower quadrant pain: Secondary | ICD-10-CM | POA: Diagnosis present

## 2021-08-16 LAB — COMPREHENSIVE METABOLIC PANEL
ALT: 21 U/L (ref 0–44)
AST: 17 U/L (ref 15–41)
Albumin: 3.3 g/dL — ABNORMAL LOW (ref 3.5–5.0)
Alkaline Phosphatase: 87 U/L (ref 38–126)
Anion gap: 3 — ABNORMAL LOW (ref 5–15)
BUN: 20 mg/dL (ref 8–23)
CO2: 24 mmol/L (ref 22–32)
Calcium: 9.5 mg/dL (ref 8.9–10.3)
Chloride: 110 mmol/L (ref 98–111)
Creatinine, Ser: 0.81 mg/dL (ref 0.61–1.24)
GFR, Estimated: >60 mL/min (ref >60)
Glucose, Bld: 110 mg/dL — ABNORMAL HIGH (ref 70–99)
Potassium: 4.6 mmol/L (ref 3.5–5.1)
Sodium: 137 mmol/L (ref 135–145)
Total Bilirubin: 0.5 mg/dL (ref 0.3–1.2)
Total Protein: 6.3 g/dL — ABNORMAL LOW (ref 6.5–8.1)

## 2021-08-16 LAB — CBC
HCT: 46.5 % (ref 39.0–52.0)
Hemoglobin: 15.7 g/dL (ref 13.0–17.0)
MCH: 32.1 pg (ref 26.0–34.0)
MCHC: 33.8 g/dL (ref 30.0–36.0)
MCV: 95.1 fL (ref 80.0–100.0)
Platelets: 262 10*3/uL (ref 150–400)
RBC: 4.89 MIL/uL (ref 4.22–5.81)
RDW: 13.5 % (ref 11.5–15.5)
WBC: 10.9 10*3/uL — ABNORMAL HIGH (ref 4.0–10.5)
nRBC: 0 % (ref 0.0–0.2)

## 2021-08-16 LAB — URINALYSIS, MICROSCOPIC (REFLEX): RBC / HPF: >50 RBC/hpf (ref 0–5)

## 2021-08-16 LAB — URINALYSIS, ROUTINE W REFLEX MICROSCOPIC
Bilirubin Urine: NEGATIVE
Glucose, UA: NEGATIVE mg/dL
Ketones, ur: NEGATIVE mg/dL
Leukocytes,Ua: NEGATIVE
Nitrite: NEGATIVE
Protein, ur: 30 mg/dL — AB
Specific Gravity, Urine: >=1.03 (ref 1.005–1.030)
pH: 5.5 (ref 5.0–8.0)

## 2021-08-16 LAB — LIPASE, BLOOD: Lipase: 28 U/L (ref 11–51)

## 2021-08-16 MED ORDER — KETOROLAC TROMETHAMINE 15 MG/ML IJ SOLN
15.0000 mg | Freq: Once | INTRAMUSCULAR | Status: AC
  Administered 2021-08-16: 15 mg via INTRAVENOUS
  Filled 2021-08-16: qty 1

## 2021-08-16 MED ORDER — TAMSULOSIN HCL 0.4 MG PO CAPS: 0.4000 mg | ORAL_CAPSULE | Freq: Every day | ORAL | 0 refills | Status: AC

## 2021-08-16 MED ORDER — IOHEXOL 300 MG/ML  SOLN
100.0000 mL | Freq: Once | INTRAMUSCULAR | Status: AC | PRN
  Administered 2021-08-16: 100 mL via INTRAVENOUS

## 2021-08-16 MED ORDER — OXYCODONE-ACETAMINOPHEN 5-325 MG PO TABS: 1.0000 | ORAL_TABLET | Freq: Four times a day (QID) | ORAL | 0 refills | Status: AC | PRN

## 2021-08-16 MED ORDER — TAMSULOSIN HCL 0.4 MG PO CAPS
0.4000 mg | ORAL_CAPSULE | ORAL | Status: AC
Start: 2021-08-16 — End: 2021-08-16
  Administered 2021-08-16: 0.4 mg via ORAL
  Filled 2021-08-16: qty 1

## 2021-08-16 MED ORDER — MORPHINE SULFATE (PF) 4 MG/ML IV SOLN
4.0000 mg | Freq: Once | INTRAVENOUS | Status: AC
  Administered 2021-08-16: 4 mg via INTRAVENOUS
  Filled 2021-08-16: qty 1

## 2021-08-16 MED ORDER — SODIUM CHLORIDE 0.9 % IV BOLUS
1000.0000 mL | Freq: Once | INTRAVENOUS | Status: AC
  Administered 2021-08-16: 1000 mL via INTRAVENOUS

## 2021-08-16 NOTE — ED Notes (Signed)
Patient provided with urine strainer

## 2021-08-16 NOTE — Discharge Instructions (Addendum)
You had a kidney stone located in your ureter (possibly two). Strain all urine with a strainer that was provided to you.  Make sure you drink plenty of water to maintain hydration.  Please use Tylenol or ibuprofen for pain.  You may use 600 mg ibuprofen every 6 hours or 1000 mg of Tylenol every 6 hours.  You may choose to alternate between the 2.  This would be most effective.  Not to exceed 4 g of Tylenol within 24 hours.  Not to exceed 3200 mg ibuprofen 24 hours.     I have prescribed or offered you other medicine for breakthrough pain. Notably there is a small amount of Tylenol--specifically 325 mg--in each dose of Percocet.  If you do take a dose of Percocet please take a 500 mg instead of the 1000 mg dose of Tylenol.     Follow up with [facility] I have given you the information for their office.  Generally kidney stones pass on their own given time in the focus of treatment is minimizing pain

## 2021-08-16 NOTE — ED Triage Notes (Signed)
Patient here POV from Home.  Endorses Pain to Left Flank and Left Testicle for approximately 1 Hour. No Discernable Urinary Symptoms besides Darker Urine.  No N/V/D. No Fevers.   NAD Noted during Triage. A&Ox4. GCS 15. Ambulatory.

## 2021-08-16 NOTE — ED Provider Notes (Signed)
[facility] EMERGENCY DEPARTMENT Provider Note   CSN: [id number] Arrival date & time: 08/16/21  1519     History  Chief Complaint  Patient presents with   Flank Pain    Albert [name] is a 66 y.o. male.   Flank Pain  Patient is a 66 year old male with a past medical history significant for nephrolithiasis, inguinal hernia repair on the left side with mesh x2, polypectomy, fistulotomy, reflux, HLD  Patient is presenting to the emergency room today with complaints of left flank and left testicle pain for 2 weeks with progressive worsening in the pain and increase in the frequency of episodes.  Seems that 2 weeks ago he had an episode every few days of pain which were brief and resolved.  He states that over the past 1 week his episodes have become as frequent as once a day and have lasted longer.  He states that his pain became constant approximately 1 hour ago has been constant since.  He has been pooping normally and passing gas, no nausea or vomiting.  He states that he feels somewhat like he has had a kidney stone in the past.  He states that he has had some relief with ibuprofen but it is temporary.  No cough congestion fever no burning with urination no hesitancy or dribbling and no penile discharge.     Home Medications Prior to Admission medications   Medication Sig Start Date End Date Taking? Authorizing Provider  oxyCODONE-acetaminophen (PERCOCET/ROXICET) 5-325 MG tablet Take 1 tablet by mouth every 6 (six) hours as needed for severe pain. 08/16/21  Yes Devanny Palecek, Ova Freshwater S, PA  tamsulosin (FLOMAX) 0.4 MG CAPS capsule Take 1 capsule (0.4 mg total) by mouth daily. 08/16/21  Yes Capers Hagmann, Ova Freshwater S, PA  amoxicillin (AMOXIL) 500 MG capsule Take 1 capsule by mouth 3 times a day until finished 07/18/21     cyclobenzaprine (FLEXERIL) 5 MG tablet Take 1 tablet by mouth 3 times a day as needed for muscle spasm 09/04/20     esomeprazole (NEXIUM) 40 MG capsule TAKE 1 CAPSULE BY  MOUTH DAILY TO CONTROL ACID 09/04/20     fluticasone (FLONASE) 50 MCG/ACT nasal spray Place 1 to 2 sprays into each nostril daily as needed for allergies 09/04/20     meloxicam (MOBIC) 15 MG tablet Take 1/2 to 1 tablet by mouth daily with food as needed for arthritis pain 09/04/20     ondansetron (ZOFRAN-ODT) 4 MG disintegrating tablet Take 1 tablet (4 mg total) by mouth every 8 (eight) hours as needed for nausea or vomiting. 04/07/21   Isla Pence, MD  Pramoxine-HC (PRAMOSONE) 1-2.5 % OINT Apply twice a day as needed for hemorrhoids 09/04/20     sildenafil (VIAGRA) 100 MG tablet Take 1/2 to one as needed for ED Patient not taking: Reported on 10/07/2019 08/30/19   [provider]      Allergies    Patient has no known allergies.    Review of Systems   Review of Systems  Genitourinary:  Positive for flank pain.   Physical Exam Updated Vital Signs BP 124/78   Pulse 69   Temp 98.1 F (36.7 C) (Oral)   Resp 16   Ht '5\' 11"'$  (1.803 m)   Wt 72.6 kg   SpO2 95%   BMI 22.32 kg/m  Physical Exam Vitals and nursing note reviewed.  Constitutional:      General: He is in acute distress.     Comments: Uncomfortable 66 year old male.  Able to answer questions properly follow commands.  Pleasant.  HENT:     Head: Normocephalic and atraumatic.     Nose: Nose normal.     Mouth/Throat:     Mouth: Mucous membranes are dry.  Eyes:     General: No scleral icterus. Cardiovascular:     Rate and Rhythm: Normal rate and regular rhythm.     Pulses: Normal pulses.     Heart sounds: Normal heart sounds.  Pulmonary:     Effort: Pulmonary effort is normal. No respiratory distress.     Breath sounds: No wheezing.  Abdominal:     Palpations: Abdomen is soft.     Tenderness: There is abdominal tenderness. There is no right CVA tenderness, left CVA tenderness, guarding or rebound.     Comments: Symptoms palpation of the left lower quadrant of the abdomen.  Musculoskeletal:     Cervical back:  Normal range of motion.     Right lower leg: No edema.     Left lower leg: No edema.  Skin:    General: Skin is warm and dry.     Capillary Refill: Capillary refill takes less than 2 seconds.  Neurological:     Mental Status: He is alert. Mental status is at baseline.  Psychiatric:        Mood and Affect: Mood normal.        Behavior: Behavior normal.    ED Results / Procedures / Treatments   Labs (all labs ordered are listed, but only abnormal results are displayed) Labs Reviewed  URINALYSIS, ROUTINE W REFLEX MICROSCOPIC - Abnormal; Notable for the following components:      Result Value   APPearance CLOUDY (*)    Hgb urine dipstick LARGE (*)    Protein, ur 30 (*)    All other components within normal limits  CBC - Abnormal; Notable for the following components:   WBC 10.9 (*)    All other components within normal limits  URINALYSIS, MICROSCOPIC (REFLEX) - Abnormal; Notable for the following components:   Bacteria, UA MANY (*)    All other components within normal limits  COMPREHENSIVE METABOLIC PANEL - Abnormal; Notable for the following components:   Glucose, Bld 110 (*)    Total Protein 6.3 (*)    Albumin 3.3 (*)    Anion gap 3 (*)    All other components within normal limits  LIPASE, BLOOD    EKG None  Radiology CT ABDOMEN PELVIS W CONTRAST  Result Date: 08/16/2021 CLINICAL DATA:  Left lower quadrant pain left flank pain EXAM: CT ABDOMEN AND PELVIS WITH CONTRAST TECHNIQUE: Multidetector CT imaging of the abdomen and pelvis was performed using the standard protocol following bolus administration of intravenous contrast. RADIATION DOSE REDUCTION: This exam was performed according to the departmental dose-optimization program which includes automated exposure control, adjustment of the mA and/or kV according to patient size and/or use of iterative reconstruction technique. CONTRAST:  174m OMNIPAQUE IOHEXOL 300 MG/ML  SOLN COMPARISON:  CT 04/07/2021 FINDINGS: Lower  chest: Lung bases demonstrate no acute airspace disease. Hepatobiliary: No focal liver abnormality is seen. No gallstones, gallbladder wall thickening, or biliary dilatation. Pancreas: Unremarkable. No pancreatic ductal dilatation or surrounding inflammatory changes. Spleen: Normal in size without focal abnormality. Adrenals/Urinary Tract: Adrenal glands are normal. Small intrarenal stones on the right. Cyst midpole left kidney, no follow-up imaging recommended. New mild left hydronephrosis and proximal hydroureter, secondary to 5 x 8 mm stone or 2 small adjacent stones within the proximal  left ureter approximately 2 cm distal to the UPJ. Mild urothelial enhancement of the proximal left ureter. The bladder is unremarkable. Stomach/Bowel: Stomach is within normal limits. Appendix appears normal. No evidence of bowel wall thickening, distention, or inflammatory changes. Vascular/Lymphatic: Moderate aortic atherosclerosis. No aneurysm. No suspicious lymph nodes. Reproductive: Slightly enlarged heterogeneous prostate with calcification Other: Negative for pelvic effusion or free air. Musculoskeletal: No acute or significant osseous findings. IMPRESSION: 1. Mild left hydronephrosis and proximal hydroureter secondary to a 5 x 8 mm single stone or 2 adjacent smaller stones in the proximal left ureter about 2 cm distal to the left UPJ 2. Multiple right intrarenal stones Electronically Signed   By: Donavan Foil M.D.   On: 08/16/2021 18:30   US SCROTUM W/DOPPLER  Result Date: 08/16/2021 CLINICAL DATA:  Left testicle pain EXAM: SCROTAL ULTRASOUND DOPPLER ULTRASOUND OF THE TESTICLES TECHNIQUE: Complete ultrasound examination of the testicles, epididymis, and other scrotal structures was performed. Color and spectral Doppler ultrasound were also utilized to evaluate blood flow to the testicles. COMPARISON:  None Available. FINDINGS: Right testicle Measurements: 5.2 x 2.6 x 2.9 cm. No mass or microlithiasis visualized. Left  testicle Measurements: 5.2 x 2.3 x 3 cm. No mass or microlithiasis visualized. Right epididymis:  Normal in size and appearance. Left epididymis:  Normal in size and appearance. Hydrocele:  None visualized. Varicocele:  None visualized. Pulsed Doppler interrogation of both testes demonstrates normal low resistance arterial and venous waveforms bilaterally. IMPRESSION: 1. Negative scrotal ultrasound.  No evidence for torsion Electronically Signed   By: Donavan Foil M.D.   On: 08/16/2021 18:31    Procedures Procedures    Medications Ordered in ED Medications  morphine (PF) 4 MG/ML injection 4 mg (4 mg Intravenous Given 08/16/21 1621)  sodium chloride 0.9 % bolus 1,000 mL (0 mLs Intravenous Stopped 08/16/21 1720)  iohexol (OMNIPAQUE) 300 MG/ML solution 100 mL (100 mLs Intravenous Contrast Given 08/16/21 1815)  ketorolac (TORADOL) 15 MG/ML injection 15 mg (15 mg Intravenous Given 08/16/21 1942)  tamsulosin (FLOMAX) capsule 0.4 mg (0.4 mg Oral Given 08/16/21 1942)    ED Course/ Medical Decision Making/ A&P Clinical Course as of 08/16/21 2229  Fri Aug 16, 2021  1552 2 weeks ago had some intermittent pain This past week more often (every day it would hurt but ease off w advil).  Felt like a [place] so drank lots of water and cranberry juice. Today became constant.  [WF]  1659 Hgb urine dipstick(!): LARGE Hematuria present.  More likely than not patient is experiencing acute kidney stone however given the history of 2 hernia repairs on his left side where he is having pain will obtain contrasted CT scan for better visualization of soft tissues and intra-abdominal contents. [WF]  1740 I personally viewed ultrasound images of scrotum I do not appreciate any abnormalities. I reviewed the radiology report and agree with its read. [WF]  1846 IMPRESSION: 1. Mild left hydronephrosis and proximal hydroureter secondary to a 5 x 8 mm single stone or 2 adjacent smaller stones in the proximal left ureter about 2 cm  distal to the left UPJ 2. Multiple right intrarenal stones   [WF]    Clinical Course User Index [WF] Tedd Sias, PA                           Medical Decision Making Amount and/or Complexity of Data Reviewed Labs: ordered. Decision-making details documented in ED Course. Radiology: ordered.  Risk Prescription drug management.   This patient presents to the ED for concern of flankpain/testicle pain, this involves a number of treatment options, and is a complaint that carries with it a high risk of complications and morbidity.  The differential diagnosis includes testicular torsion, mass, epididymitis.  Also has flank pain which is the differential describe below.  The causes of generalized abdominal pain include but are not limited to AAA, mesenteric ischemia, appendicitis, diverticulitis, DKA, gastritis, gastroenteritis, AMI, nephrolithiasis, pancreatitis, peritonitis, adrenal insufficiency,lead poisoning, iron toxicity, intestinal ischemia, constipation, UTI,SBO/LBO, splenic rupture, biliary disease, IBD, IBS, PUD, or hepatitis.    Co morbidities: Discussed in HPI   Brief History:   Patient is a 66 year old male with a past medical history significant for nephrolithiasis, inguinal hernia repair on the left side with mesh x2, polypectomy, fistulotomy, reflux, HLD  Patient is presenting to the emergency room today with complaints of left flank and left testicle pain for 2 weeks with progressive worsening in the pain and increase in the frequency of episodes.  Seems that 2 weeks ago he had an episode every few days of pain which were brief and resolved.  He states that over the past 1 week his episodes have become as frequent as once a day and have lasted longer.  He states that his pain became constant approximately 1 hour ago has been constant since.  He has been pooping normally and passing gas, no nausea or vomiting.  He states that he feels somewhat like he has had a kidney  stone in the past.  He states that he has had some relief with ibuprofen but it is temporary.  No cough congestion fever no burning with urination no hesitancy or dribbling and no penile discharge.    EMR reviewed including pt PMHx, past surgical history and past visits to ER.   See HPI for more details   Lab Tests:   I ordered and independently interpreted labs. Labs notable for    Imaging Studies:  Abnormal findings. I personally reviewed all imaging studies. Imaging notable for Patient with 5 x 8 mm stone versus perhaps 2 smaller adjacent stones in the proximal left ureter.  It seems that on my viewing of the images that this appears more like 2 small stones although a snowman shaped to stone configuration is possible.   Cardiac Monitoring:  NA NA   Medicines ordered:  I ordered medication including morphine, Toradol, tamsulosin, 1 L normal saline for pain and for hydration Reevaluation of the patient after these medicines showed that the patient improved I have reviewed the patients home medicines and have made adjustments as needed   Critical Interventions:     Consults/Attending Physician      Reevaluation:  After the interventions noted above I re-evaluated patient and found that they have :improved   Social Determinants of Health:      Problem List / ED Course:  Nearly complete resolution of pain after analgesia.  He feels much improved.  Has no urinary frequency urgency dysuria hematuria.  There is some blood in his urinalysis however.  Does have some bacteria but negative for nitrates and leukocytes.  Given the size versus perhaps 2 stones recommend close follow-up with urology.  Return precautions discussed.  Discharged home with tamsulosin analgesia and strict return precautions.   Dispostion:  After consideration of the diagnostic results and the patients response to treatment, I feel that the patent would benefit from close outpatient  follow-up.    Final Clinical  Impression(s) / ED Diagnoses Final diagnoses:  Ureteral stone    Rx / DC Orders ED Discharge Orders          Ordered    oxyCODONE-acetaminophen (PERCOCET/ROXICET) 5-325 MG tablet  Every 6 hours PRN        08/16/21 1928    tamsulosin (FLOMAX) 0.4 MG CAPS capsule  Daily        08/16/21 1928              Tedd Sias, [place] 08/16/21 2229    Ezequiel Essex, MD 08/16/21 2329

## 2021-08-22 DIAGNOSIS — N281 Cyst of kidney, acquired: Secondary | ICD-10-CM | POA: Diagnosis not present

## 2021-08-22 DIAGNOSIS — N202 Calculus of kidney with calculus of ureter: Secondary | ICD-10-CM | POA: Diagnosis not present

## 2021-08-22 DIAGNOSIS — Z125 Encounter for screening for malignant neoplasm of prostate: Secondary | ICD-10-CM | POA: Diagnosis not present

## 2021-09-04 DIAGNOSIS — N202 Calculus of kidney with calculus of ureter: Secondary | ICD-10-CM | POA: Diagnosis not present

## 2021-09-05 ENCOUNTER — Other Ambulatory Visit (HOSPITAL_COMMUNITY): Payer: Self-pay

## 2021-09-05 DIAGNOSIS — H1010 Acute atopic conjunctivitis, unspecified eye: Secondary | ICD-10-CM | POA: Diagnosis not present

## 2021-09-05 DIAGNOSIS — M129 Arthropathy, unspecified: Secondary | ICD-10-CM | POA: Diagnosis not present

## 2021-09-05 DIAGNOSIS — N529 Male erectile dysfunction, unspecified: Secondary | ICD-10-CM | POA: Diagnosis not present

## 2021-09-05 DIAGNOSIS — J302 Other seasonal allergic rhinitis: Secondary | ICD-10-CM | POA: Diagnosis not present

## 2021-09-05 DIAGNOSIS — N200 Calculus of kidney: Secondary | ICD-10-CM | POA: Diagnosis not present

## 2021-09-05 DIAGNOSIS — K219 Gastro-esophageal reflux disease without esophagitis: Secondary | ICD-10-CM | POA: Diagnosis not present

## 2021-09-05 MED ORDER — SILDENAFIL CITRATE 100 MG PO TABS
ORAL_TABLET | ORAL | 3 refills | Status: AC
  Filled 2021-09-05: qty 6, 30d supply, fill #0
  Filled 2021-10-26: qty 6, 30d supply, fill #1
  Filled 2022-02-16: qty 6, 30d supply, fill #2
  Filled 2022-05-14: qty 6, 30d supply, fill #3
  Filled 2022-08-29: qty 6, 30d supply, fill #4

## 2021-09-05 MED ORDER — ESOMEPRAZOLE MAGNESIUM 40 MG PO CPDR
DELAYED_RELEASE_CAPSULE | ORAL | 3 refills | Status: DC
  Filled 2021-10-26: qty 90, 90d supply, fill #0
  Filled 2022-02-16: qty 90, 90d supply, fill #1
  Filled 2022-05-14: qty 90, 90d supply, fill #2
  Filled 2022-08-29: qty 90, 90d supply, fill #3

## 2021-09-05 MED ORDER — OLOPATADINE HCL 0.1 % OP SOLN
OPHTHALMIC | 0 refills | Status: AC
  Filled 2021-09-05: qty 5, 25d supply, fill #0

## 2021-09-05 MED ORDER — MELOXICAM 15 MG PO TABS
ORAL_TABLET | ORAL | 3 refills | Status: DC
  Filled 2021-09-05 – 2021-10-26 (×2): qty 90, 90d supply, fill #0
  Filled 2022-02-16: qty 90, 90d supply, fill #1
  Filled 2022-05-14: qty 90, 90d supply, fill #2
  Filled 2022-08-29: qty 90, 90d supply, fill #3

## 2021-09-05 MED ORDER — PRAMOSONE 1-2.5 % EX OINT
TOPICAL_OINTMENT | CUTANEOUS | 5 refills | Status: DC
  Filled 2021-09-05: qty 56.8, 30d supply, fill #0
  Filled 2021-10-26: qty 56.8, 30d supply, fill #1
  Filled 2022-05-14: qty 56.8, 30d supply, fill #2
  Filled 2022-08-29: qty 56.8, 30d supply, fill #3

## 2021-09-05 MED ORDER — FLUTICASONE PROPIONATE 50 MCG/ACT NA SUSP
NASAL | 3 refills | Status: DC
Start: 2021-09-05 — End: 2023-08-27
  Filled 2021-10-26: qty 16, 30d supply, fill #0
  Filled 2022-05-14: qty 16, 30d supply, fill #1
  Filled 2022-08-29: qty 16, 30d supply, fill #2

## 2021-09-05 MED ORDER — CYCLOBENZAPRINE HCL 5 MG PO TABS
ORAL_TABLET | ORAL | 3 refills | Status: AC
  Filled 2021-09-05: qty 60, 20d supply, fill #0

## 2021-09-06 ENCOUNTER — Other Ambulatory Visit (HOSPITAL_COMMUNITY): Payer: Self-pay

## 2021-09-12 DIAGNOSIS — S134XXA Sprain of ligaments of cervical spine, initial encounter: Secondary | ICD-10-CM | POA: Diagnosis not present

## 2021-09-12 DIAGNOSIS — M5411 Radiculopathy, occipito-atlanto-axial region: Secondary | ICD-10-CM | POA: Diagnosis not present

## 2021-09-12 DIAGNOSIS — M62830 Muscle spasm of back: Secondary | ICD-10-CM | POA: Diagnosis not present

## 2021-09-27 ENCOUNTER — Other Ambulatory Visit (HOSPITAL_COMMUNITY): Payer: Self-pay

## 2021-09-27 DIAGNOSIS — N201 Calculus of ureter: Secondary | ICD-10-CM | POA: Diagnosis not present

## 2021-09-27 MED ORDER — TAMSULOSIN HCL 0.4 MG PO CAPS
ORAL_CAPSULE | ORAL | 11 refills | Status: AC
Start: 2021-09-27 — End: ?
  Filled 2021-09-27: qty 30, 30d supply, fill #0
  Filled 2022-01-21: qty 30, 30d supply, fill #1
  Filled 2022-05-14: qty 30, 30d supply, fill #2
  Filled 2022-07-15: qty 30, 30d supply, fill #3
  Filled 2022-08-29: qty 30, 30d supply, fill #4

## 2021-10-04 DIAGNOSIS — N201 Calculus of ureter: Secondary | ICD-10-CM | POA: Diagnosis not present

## 2021-10-04 DIAGNOSIS — N289 Disorder of kidney and ureter, unspecified: Secondary | ICD-10-CM | POA: Diagnosis not present

## 2021-10-04 DIAGNOSIS — I700 Atherosclerosis of aorta: Secondary | ICD-10-CM | POA: Diagnosis not present

## 2021-10-04 DIAGNOSIS — N132 Hydronephrosis with renal and ureteral calculous obstruction: Secondary | ICD-10-CM | POA: Diagnosis not present

## 2021-10-04 DIAGNOSIS — N400 Enlarged prostate without lower urinary tract symptoms: Secondary | ICD-10-CM | POA: Diagnosis not present

## 2021-10-09 ENCOUNTER — Other Ambulatory Visit: Payer: Self-pay | Admitting: Urology

## 2021-10-09 ENCOUNTER — Encounter (HOSPITAL_COMMUNITY)
Admission: RE | Admit: 2021-10-09 | Discharge: 2021-10-09 | Disposition: A | Discharge status: Home / Self Care | Payer: 59 | Source: Ambulatory Visit | Attending: Urology | Admitting: Urology

## 2021-10-09 ENCOUNTER — Encounter (HOSPITAL_COMMUNITY): Payer: Self-pay

## 2021-10-09 ENCOUNTER — Other Ambulatory Visit: Payer: Self-pay

## 2021-10-09 VITALS — BP 128/77 | HR 70 | Temp 98.4°F | Resp 20 | Ht 70.0 in | Wt 161.4 lb

## 2021-10-09 DIAGNOSIS — N289 Disorder of kidney and ureter, unspecified: Secondary | ICD-10-CM | POA: Diagnosis not present

## 2021-10-09 DIAGNOSIS — Z01812 Encounter for preprocedural laboratory examination: Secondary | ICD-10-CM | POA: Insufficient documentation

## 2021-10-09 HISTORY — DX: Personal history of urinary calculi: Z87.442

## 2021-10-09 LAB — BASIC METABOLIC PANEL
Anion gap: 5 (ref 5–15)
BUN: 20 mg/dL (ref 8–23)
CO2: 24 mmol/L (ref 22–32)
Calcium: 10.1 mg/dL (ref 8.9–10.3)
Chloride: 107 mmol/L (ref 98–111)
Creatinine, Ser: 0.71 mg/dL (ref 0.61–1.24)
GFR, Estimated: >60 mL/min (ref >60)
Glucose, Bld: 107 mg/dL — ABNORMAL HIGH (ref 70–99)
Potassium: 3.5 mmol/L (ref 3.5–5.1)
Sodium: 136 mmol/L (ref 135–145)

## 2021-10-09 NOTE — Patient Instructions (Addendum)
DUE TO COVID-19 ONLY TWO VISITORS  (aged 64 and older)  IS ALLOWED TO COME WITH YOU AND STAY IN THE WAITING ROOM ONLY DURING PRE OP AND PROCEDURE.   **NO VISITORS ARE ALLOWED IN THE SHORT STAY AREA OR RECOVERY ROOM!!**   You are not required to quarantine Hand Hygiene often Do NOT share personal items Notify your provider if you are in close contact with someone who has COVID or you develop fever 100.4 or greater, new onset of sneezing, cough, sore throat, shortness of breath or body aches.        Your procedure is scheduled on:  10-15-21   Report to [facility] Main Entrance    Report to admitting at 1:15 PM   Call this number if you have problems the morning of surgery [phone]   Do not eat food :After Midnight the night before surgery   After Midnight you may have the following liquids until 12:30 PM DAY OF SURGERY  Clear Liquid Diet Water Black Coffee (sugar ok, NO MILK/CREAM OR CREAMERS)  Tea (sugar ok, NO MILK/CREAM OR CREAMERS) regular and decaf                             Plain Jell-O (NO RED)                                           Fruit ices (not with fruit pulp, NO RED)                                     Popsicles (NO RED)                                                                  Juice: apple, WHITE grape, WHITE cranberry Sports drinks like Gatorade (NO RED)                        If you have questions, please contact your surgeon's office.   FOLLOW  ANY ADDITIONAL PRE OP INSTRUCTIONS YOU RECEIVED FROM YOUR SURGEON'S OFFICE!!!     Oral Hygiene is also important to reduce your risk of infection.                                    Remember - BRUSH YOUR TEETH THE MORNING OF SURGERY WITH YOUR REGULAR TOOTHPASTE   Do NOT smoke after Midnight   Take these medicines the morning of surgery with A SIP OF WATER:  Nexium, Tamsulosin, Oxycodone and Ondansetron if needed   DO NOT [place]. PHARMACY WILL DISPENSE  MEDICATIONS LISTED ON YOUR MEDICATION LIST TO YOU DURING YOUR ADMISSION [place]!             You may not have any metal on your body including  jewelry, and body piercing             Do not wear  lotions, powders, cologne, or deodorant              Men may shave face and neck.    Contacts, dentures or bridgework may not be worn into surgery.  Do not bring valuables to the hospital. [place].   Patients discharged on the day of surgery will not be allowed to drive home.  Someone NEEDS to stay with you for the first 24 hours after anesthesia.  Please read over the following fact sheets you were given: IF YOU HAVE QUESTIONS ABOUT YOUR PRE-OP INSTRUCTIONS PLEASE CALL [facility] - Preparing for Surgery Before surgery, you can play an important role.  Because skin is not sterile, your skin needs to be as free of germs as possible.  You can reduce the number of germs on your skin by washing with CHG (chlorahexidine gluconate) soap before surgery.  CHG is an antiseptic cleaner which kills germs and bonds with the skin to continue killing germs even after washing. Please DO NOT use if you have an allergy to CHG or antibacterial soaps.  If your skin becomes reddened/irritated stop using the CHG and inform your nurse when you arrive at Short Stay. Do not shave (including legs and underarms) for at least 48 hours prior to the first CHG shower.  You may shave your face/neck.  Please follow these instructions carefully:  1.  Shower with CHG Soap the night before surgery and the  morning of surgery.  2.  If you choose to wash your hair, wash your hair first as usual with your normal  shampoo.  3.  After you shampoo, rinse your hair and body thoroughly to remove the shampoo.                             4.  Use CHG as you would any other liquid soap.  You can apply chg directly to the skin and wash.  Gently with a scrungie or clean  washcloth.  5.  Apply the CHG Soap to your body ONLY FROM THE NECK DOWN.   Do   not use on face/ open                           Wound or open sores. Avoid contact with eyes, ears mouth and   genitals (private parts).                       Wash face,  Genitals (private parts) with your normal soap.             6.  Wash thoroughly, paying special attention to the area where your    surgery  will be performed.  7.  Thoroughly rinse your body with warm water from the neck down.  8.  DO NOT shower/wash with your normal soap after using and rinsing off the CHG Soap.                9.  Pat yourself dry with a clean towel.            10.  Wear clean pajamas.            11.  Place clean sheets on your bed the night of your first shower and do not  sleep with pets. Day of Surgery : Do not apply any  lotions/deodorants the morning of surgery.  Please wear clean clothes to the hospital/surgery center.  FAILURE TO FOLLOW THESE INSTRUCTIONS MAY RESULT IN THE CANCELLATION OF YOUR SURGERY  PATIENT SIGNATURE_________________________________  NURSE SIGNATURE__________________________________  ________________________________________________________________________

## 2021-10-09 NOTE — Progress Notes (Addendum)
COVID Vaccine Completed:  Yes x2  Date of COVID positive in last 90 days:  No  PCP - Kathryne Eriksson, MD Cardiologist - N/A  Chest x-ray - N/A EKG - N/A Stress Test - N/A ECHO - N/A Cardiac Cath - N/A Pacemaker/ICD device last checked: Spinal Cord Stimulator:N/A  Bowel Prep - N/A  Sleep Study - N/A CPAP -   Fasting Blood Sugar - N/A Checks Blood Sugar _____ times a day  Blood Thinner Instructions:  N/A Aspirin Instructions: Last Dose:  Activity level:   Can go up a flight of stairs and perform activities of daily living without stopping and without symptoms of chest pain or shortness of breath.  Anesthesia review: N/A  Patient denies shortness of breath, fever, cough and chest pain at PAT appointment  Patient verbalized understanding of instructions that were given to them at the PAT appointment. Patient was also instructed that they will need to review over the PAT instructions again at home before surgery.

## 2021-10-15 ENCOUNTER — Encounter (HOSPITAL_COMMUNITY): Payer: Self-pay | Admitting: Urology

## 2021-10-15 ENCOUNTER — Ambulatory Visit (HOSPITAL_BASED_OUTPATIENT_CLINIC_OR_DEPARTMENT_OTHER): Payer: 59 | Admitting: Anesthesiology

## 2021-10-15 ENCOUNTER — Encounter (HOSPITAL_COMMUNITY)
Admission: RE | Disposition: A | Discharge status: Home / Self Care | Payer: Self-pay | Source: Home / Self Care | Attending: Urology

## 2021-10-15 ENCOUNTER — Other Ambulatory Visit (HOSPITAL_COMMUNITY): Payer: Self-pay

## 2021-10-15 ENCOUNTER — Ambulatory Visit (HOSPITAL_COMMUNITY): Payer: 59

## 2021-10-15 ENCOUNTER — Other Ambulatory Visit: Payer: Self-pay

## 2021-10-15 ENCOUNTER — Ambulatory Visit (HOSPITAL_COMMUNITY)
Admission: RE | Admit: 2021-10-15 | Discharge: 2021-10-15 | Disposition: A | Discharge status: Home / Self Care | Payer: 59 | Attending: Urology | Admitting: Urology

## 2021-10-15 ENCOUNTER — Ambulatory Visit (HOSPITAL_COMMUNITY): Payer: 59 | Admitting: Anesthesiology

## 2021-10-15 DIAGNOSIS — K219 Gastro-esophageal reflux disease without esophagitis: Secondary | ICD-10-CM | POA: Diagnosis not present

## 2021-10-15 DIAGNOSIS — F17200 Nicotine dependence, unspecified, uncomplicated: Secondary | ICD-10-CM | POA: Diagnosis not present

## 2021-10-15 DIAGNOSIS — Z79899 Other long term (current) drug therapy: Secondary | ICD-10-CM | POA: Insufficient documentation

## 2021-10-15 DIAGNOSIS — N201 Calculus of ureter: Secondary | ICD-10-CM

## 2021-10-15 DIAGNOSIS — N289 Disorder of kidney and ureter, unspecified: Secondary | ICD-10-CM

## 2021-10-15 HISTORY — PX: HOLMIUM LASER APPLICATION: SHX5852

## 2021-10-15 HISTORY — PX: CYSTOSCOPY WITH RETROGRADE PYELOGRAM, URETEROSCOPY AND STENT PLACEMENT: SHX5789

## 2021-10-15 SURGERY — CYSTOURETEROSCOPY, WITH RETROGRADE PYELOGRAM AND STENT INSERTION
Anesthesia: General | Laterality: Left

## 2021-10-15 MED ORDER — TAMSULOSIN HCL 0.4 MG PO CAPS
0.4000 mg | ORAL_CAPSULE | Freq: Every day | ORAL | 0 refills | Status: AC
Start: 2021-10-15 — End: ?
  Filled 2021-10-15 – 2022-09-24 (×3): qty 30, 30d supply, fill #0

## 2021-10-15 MED ORDER — IOHEXOL 300 MG/ML  SOLN
INTRAMUSCULAR | Status: DC | PRN
  Administered 2021-10-15: 10 mL

## 2021-10-15 MED ORDER — LIDOCAINE HCL (CARDIAC) PF 100 MG/5ML IV SOSY
PREFILLED_SYRINGE | INTRAVENOUS | Status: DC | PRN
  Administered 2021-10-15: 60 mg via INTRAVENOUS

## 2021-10-15 MED ORDER — TRAMADOL HCL 50 MG PO TABS
50.0000 mg | ORAL_TABLET | Freq: Four times a day (QID) | ORAL | 0 refills | Status: AC | PRN
  Filled 2021-10-15: qty 20, 5d supply, fill #0

## 2021-10-15 MED ORDER — DEXAMETHASONE SODIUM PHOSPHATE 10 MG/ML IJ SOLN
INTRAMUSCULAR | Status: AC
  Filled 2021-10-15: qty 1

## 2021-10-15 MED ORDER — MIDAZOLAM HCL 2 MG/2ML IJ SOLN
INTRAMUSCULAR | Status: AC
  Filled 2021-10-15: qty 2

## 2021-10-15 MED ORDER — LIDOCAINE HCL (PF) 2 % IJ SOLN
INTRAMUSCULAR | Status: AC
  Filled 2021-10-15: qty 5

## 2021-10-15 MED ORDER — DEXAMETHASONE SODIUM PHOSPHATE 10 MG/ML IJ SOLN
INTRAMUSCULAR | Status: DC | PRN
  Administered 2021-10-15: 10 mg via INTRAVENOUS

## 2021-10-15 MED ORDER — SODIUM CHLORIDE 0.9 % IR SOLN
Status: DC | PRN
  Administered 2021-10-15: 3000 mL

## 2021-10-15 MED ORDER — PROPOFOL 10 MG/ML IV BOLUS
INTRAVENOUS | Status: AC
  Filled 2021-10-15: qty 20

## 2021-10-15 MED ORDER — CEPHALEXIN 500 MG PO CAPS
500.0000 mg | ORAL_CAPSULE | Freq: Once | ORAL | 0 refills | Status: AC
  Filled 2021-10-15: qty 1, 1d supply, fill #0

## 2021-10-15 MED ORDER — LACTATED RINGERS IV SOLN: INTRAVENOUS | Status: DC

## 2021-10-15 MED ORDER — ACETAMINOPHEN 500 MG PO TABS
1000.0000 mg | ORAL_TABLET | Freq: Once | ORAL | Status: AC
Start: 2021-10-15 — End: 2021-10-15
  Administered 2021-10-15: 1000 mg via ORAL
  Filled 2021-10-15: qty 2

## 2021-10-15 MED ORDER — ONDANSETRON HCL 4 MG/2ML IJ SOLN
INTRAMUSCULAR | Status: AC
  Filled 2021-10-15: qty 2

## 2021-10-15 MED ORDER — CHLORHEXIDINE GLUCONATE 0.12 % MT SOLN
15.0000 mL | Freq: Once | OROMUCOSAL | Status: AC
  Administered 2021-10-15: 15 mL via OROMUCOSAL

## 2021-10-15 MED ORDER — PROPOFOL 10 MG/ML IV BOLUS
INTRAVENOUS | Status: DC | PRN
  Administered 2021-10-15: 10 mg via INTRAVENOUS

## 2021-10-15 MED ORDER — MIDAZOLAM HCL 5 MG/5ML IJ SOLN
INTRAMUSCULAR | Status: DC | PRN
  Administered 2021-10-15: 2 mg via INTRAVENOUS

## 2021-10-15 MED ORDER — OXYCODONE HCL 5 MG PO TABS: 5.0000 mg | ORAL_TABLET | Freq: Once | ORAL | Status: DC | PRN

## 2021-10-15 MED ORDER — FENTANYL CITRATE (PF) 100 MCG/2ML IJ SOLN
INTRAMUSCULAR | Status: AC
  Filled 2021-10-15: qty 2

## 2021-10-15 MED ORDER — OXYCODONE HCL 5 MG/5ML PO SOLN: 5.0000 mg | Freq: Once | ORAL | Status: DC | PRN

## 2021-10-15 MED ORDER — CEFAZOLIN SODIUM-DEXTROSE 2-4 GM/100ML-% IV SOLN
2.0000 g | INTRAVENOUS | Status: AC
  Administered 2021-10-15: 2 g via INTRAVENOUS
  Filled 2021-10-15: qty 100

## 2021-10-15 MED ORDER — ONDANSETRON HCL 4 MG/2ML IJ SOLN
INTRAMUSCULAR | Status: DC | PRN
  Administered 2021-10-15: 4 mg via INTRAVENOUS

## 2021-10-15 MED ORDER — FENTANYL CITRATE (PF) 100 MCG/2ML IJ SOLN
INTRAMUSCULAR | Status: DC | PRN
  Administered 2021-10-15 (×2): 50 ug via INTRAVENOUS

## 2021-10-15 MED ORDER — FENTANYL CITRATE PF 50 MCG/ML IJ SOSY: 25.0000 ug | PREFILLED_SYRINGE | INTRAMUSCULAR | Status: DC | PRN

## 2021-10-15 MED ORDER — ONDANSETRON HCL 4 MG/2ML IJ SOLN: 4.0000 mg | Freq: Once | INTRAMUSCULAR | Status: DC | PRN

## 2021-10-15 MED ORDER — ORAL CARE MOUTH RINSE: 15.0000 mL | Freq: Once | OROMUCOSAL | Status: AC

## 2021-10-15 SURGICAL SUPPLY — 21 items
BAG URO CATCHER STRL LF (MISCELLANEOUS) ×2 IMPLANT
BASKET ZERO TIP NITINOL 2.4FR (BASKET) ×1 IMPLANT
CATH URETL OPEN 5X70 (CATHETERS) ×2 IMPLANT
CLOTH BEACON ORANGE TIMEOUT ST (SAFETY) ×2 IMPLANT
DRSG TEGADERM 2-3/8X2-3/4 SM (GAUZE/BANDAGES/DRESSINGS) IMPLANT
EXTRACTOR STONE 1.7FRX115CM (UROLOGICAL SUPPLIES) IMPLANT
GLOVE BIO SURGEON STRL SZ 6.5 (GLOVE) ×2 IMPLANT
GOWN STRL REUS W/ TWL LRG LVL3 (GOWN DISPOSABLE) ×1 IMPLANT
GOWN STRL REUS W/TWL LRG LVL3 (GOWN DISPOSABLE) ×2
GUIDEWIRE STR DUAL SENSOR (WIRE) ×2 IMPLANT
KIT TURNOVER KIT A (KITS) IMPLANT
LASER FIB FLEXIVA PULSE ID 365 (Laser) ×1 IMPLANT
MANIFOLD NEPTUNE II (INSTRUMENTS) ×2 IMPLANT
PACK CYSTO (CUSTOM PROCEDURE TRAY) ×2 IMPLANT
SHEATH NAVIGATOR HD 11/13X28 (SHEATH) IMPLANT
SHEATH NAVIGATOR HD 11/13X36 (SHEATH) IMPLANT
STENT URET 6FRX26 CONTOUR (STENTS) ×1 IMPLANT
TRACTIP FLEXIVA PULS ID 200XHI (Laser) ×1 IMPLANT
TRACTIP FLEXIVA PULSE ID 200 (Laser) ×2
TUBING CONNECTING 10 (TUBING) ×2 IMPLANT
TUBING UROLOGY SET (TUBING) ×2 IMPLANT

## 2021-10-15 NOTE — Transfer of Care (Signed)
Immediate Anesthesia Transfer of Care Note  Patient: Albert [name]  Procedure(s) Performed: CYSTOSCOPY WITH RETROGRADE PYELOGRAM, URETEROSCOPY , STONE EXTRACTION AND STENT PLACEMENT (Left) HOLMIUM LASER APPLICATION (Left)  Patient Location: PACU  Anesthesia Type:General  Level of Consciousness: sedated  Airway & Oxygen Therapy: Patient Spontanous Breathing and Patient connected to face mask oxygen  Post-op Assessment: Report given to RN and Post -op Vital signs reviewed and stable  Post vital signs: Reviewed and stable  Last Vitals:  Vitals Value Taken Time  BP    Temp    Pulse 47 10/15/21 1442  Resp    SpO2 98 % 10/15/21 1442  Vitals shown include unvalidated device data.  Last Pain:  Vitals:   10/15/21 1336  TempSrc:   PainSc: 0-No pain         Complications: No notable events documented.

## 2021-10-15 NOTE — Anesthesia Procedure Notes (Signed)
Procedure Name: LMA Insertion Date/Time: 10/15/2021 2:03 PM  Performed by: Lind Covert, CRNAPre-anesthesia Checklist: Patient identified, Emergency Drugs available, Suction available, Patient being monitored and Timeout performed Patient Re-evaluated:Patient Re-evaluated prior to induction Oxygen Delivery Method: Circle system utilized Preoxygenation: Pre-oxygenation with 100% oxygen Induction Type: IV induction LMA: LMA inserted LMA Size: 5.0 Tube type: Oral Number of attempts: 1 Placement Confirmation: positive ETCO2 and breath sounds checked- equal and bilateral Tube secured with: Tape Dental Injury: Teeth and Oropharynx as per pre-operative assessment

## 2021-10-15 NOTE — Anesthesia Preprocedure Evaluation (Addendum)
Anesthesia Evaluation  Patient identified by MRN, date of birth, ID band Patient awake    Reviewed: Allergy & Precautions, NPO status , Patient's Chart, lab work & pertinent test results  History of Anesthesia Complications Negative for: history of anesthetic complications  Airway Mallampati: III  TM Distance: >3 FB Neck ROM: Full    Dental  (+) Dental Advisory Given   Pulmonary Current SmokerPatient did not abstain from smoking.,    Pulmonary exam normal        Cardiovascular negative cardio ROS Normal cardiovascular exam     Neuro/Psych negative neurological ROS  negative psych ROS   GI/Hepatic Neg liver ROS, GERD  Medicated and Controlled,  Endo/Other  negative endocrine ROS  Renal/GU negative Renal ROS     Musculoskeletal  (+) Arthritis ,   Abdominal   Peds  Hematology negative hematology ROS (+)   Anesthesia Other Findings   Reproductive/Obstetrics                            Anesthesia Physical Anesthesia Plan  ASA: 2  Anesthesia Plan: General   Post-op Pain Management: Tylenol PO (pre-op)*   Induction: Intravenous  PONV Risk Score and Plan: 2 and Treatment may vary due to age or medical condition, Ondansetron and Dexamethasone  Airway Management Planned: LMA  Additional Equipment: None  Intra-op Plan:   Post-operative Plan: Extubation in OR  Informed Consent: I have reviewed the patients History and Physical, chart, labs and discussed the procedure including the risks, benefits and alternatives for the proposed anesthesia with the patient or authorized representative who has indicated his/her understanding and acceptance.     Dental advisory given  Plan Discussed with: CRNA and Anesthesiologist  Anesthesia Plan Comments:        Anesthesia Quick Evaluation

## 2021-10-15 NOTE — Anesthesia Postprocedure Evaluation (Signed)
Anesthesia Post Note  Patient: Albert [name]  Procedure(s) Performed: CYSTOSCOPY WITH RETROGRADE PYELOGRAM, URETEROSCOPY , STONE EXTRACTION AND STENT PLACEMENT (Left) HOLMIUM LASER APPLICATION (Left)     Patient location during evaluation: PACU Anesthesia Type: General Level of consciousness: awake and alert and oriented Pain management: pain level controlled Vital Signs Assessment: post-procedure vital signs reviewed and stable Respiratory status: spontaneous breathing, nonlabored ventilation and respiratory function stable Cardiovascular status: blood pressure returned to baseline and stable Postop Assessment: no apparent nausea or vomiting Anesthetic complications: no   No notable events documented.  Last Vitals:  Vitals:   10/15/21 1500 10/15/21 1515  BP: 139/86 138/74  Pulse: (!) 58 (!) 51  Resp: 15 19  Temp:  36.6 C  SpO2: 97% 97%    Last Pain:  Vitals:   10/15/21 1515  TempSrc:   PainSc: 0-No pain                 Aydenn Gervin A.

## 2021-10-15 NOTE — H&P (Signed)
/HPI: cc: renal cyst, nephrolithiasis, BPH   08/22/21: 66 year old man last seen in [facility] in 2013 for BPH, renal cyst and nephrolithiasis now with 2 left mid ureteral calculi and nonobstructing renal calculi. Patient's pain is controlled and he is trying medical expulsive therapy. He denies any fevers or chills. He did have emesis over the weekend but this has resolved. He has Flomax and Zofran as well as pain medication. He was a previous patient of Dr. Gaynelle Arabian. He was doing well for a number of years but began passing stones over the last 6 months. He is also scheduled to see his general surgeon regarding left inguinal pain however this could likely be due to the obstructing stones as well.   09/04/2021: 66 year old man with 2 left mid ureteral calculi here for follow-up. Patient was having significant pain after his last visit however pain is subsided. KUB today does show a 6 mm calcification lateral to the sacrum. UA without hematuria today. No fevers or chills.   09/27/2021: Back today for follow-up exam regarding ongoing surveillance of a previously identified left ureteral calculus. Denies interval stone material passage. He has had no dysuria but did notice quite dark-colored urine yesterday but not seeing bright red/visible gross hematuria. He continues tamsulosin and voiding symptoms are grossly stable. With continued use of an alpha-blocker, he is actually seen some reduction in prior nocturia. Now only getting up once per night. He continues to be mildly symptomatic endorsing some discomfort in the left flank and burning pain in the left side of the groin but this is not debilitating nor requiring the use of pain medication. He denies interval fevers or chills, nausea/vomiting. He will have KUB today for further evaluation. A new finding today, there is microscopic hematuria on UA.     ALLERGIES: No Allergies    MEDICATIONS: Tamsulosin Hcl 0.4 mg capsule  MELOXICAM Daily  Nexium  40 mg capsule,delayed release Oral     GU PSH: No GU PSH      PSH Notes: Inguinal Hernia Repair, Shoulder Surgery, Knee Surgery   NON-GU PSH: Colonoscopy Inquinal hernia repair (open)     GU PMH: Renal calculus - 09/04/2021, - 08/22/2021, Nephrolithiasis, - 2014 Ureteral calculus - 09/04/2021, - 08/22/2021 Renal cyst - 08/22/2021, Renal cyst, acquired, - 2014 BPH w/o LUTS, Benign prostatic hypertrophy without lower urinary tract symptoms - 2014 ED due to arterial insufficiency, Erectile dysfunction due to arterial insufficiency - 2014 Elevated PSA, Elevated prostate specific antigen (PSA) - 2014 Inflammatory Disease Prostate, Unspec, Prostatitis - 2014 Unil Inguinal Hernia W/O obst or gang,non-recurrent, Inguinal hernia, unilateral - 2014      PMH Notes: Kidney stones.   NON-GU PMH: Personal history of other diseases of the digestive system, History of esophageal reflux - 2014 Arthritis Encounter for general adult medical examination without abnormal findings, Encounter for preventive health examination GERD    FAMILY HISTORY: Diabetes - Mother Heart Attack - Father Heart Disease - Mother Kidney Stones - Brother nephrolithiasis - Runs In Family   SOCIAL HISTORY: Marital Status: Married Preferred Language: English; Ethnicity: Not Hispanic Or Latino; Race: White Current Smoking Status: Patient smokes.   Tobacco Use Assessment Completed: Used Tobacco in last 30 days? Does not use drugs. Drinks 3 caffeinated drinks per day. Has not had a blood transfusion.     Notes: Marital History - Currently Married, Occupation:, Alcohol Use, Tobacco Use, Caffeine Use   REVIEW OF SYSTEMS:    GU Review Male:   Patient denies have to  strain to urinate , erection problems, leakage of urine, stream starts and stops, penile pain, hard to postpone urination, trouble starting your stream, get up at night to urinate, burning/ pain with urination, and frequent urination.  Gastrointestinal (Upper):    Patient denies nausea, vomiting, and indigestion/ heartburn.  Gastrointestinal (Lower):   Patient denies diarrhea and constipation.  Constitutional:   Patient denies fever, night sweats, weight loss, and fatigue.  Skin:   Patient denies skin rash/ lesion and itching.  Eyes:   Patient denies blurred vision and double vision.  Ears/ Nose/ Throat:   Patient denies sore throat and sinus problems.  Hematologic/Lymphatic:   Patient denies swollen glands and easy bruising.  Cardiovascular:   Patient denies leg swelling and chest pains.  Respiratory:   Patient denies cough and shortness of breath.  Endocrine:   Patient denies excessive thirst.  Musculoskeletal:   Patient denies back pain and joint pain.  Neurological:   Patient denies headaches and dizziness.  Psychologic:   Patient denies depression and anxiety.   Notes: doing better  request new rx for flomax    VITAL SIGNS:      09/27/2021 11:09 AM  BP 138/77 mmHg  Heart Rate 78 /min  Temperature 97.0 F / 36.1 C   MULTI-SYSTEM PHYSICAL EXAMINATION:    Constitutional: Well-nourished. No physical deformities. Normally developed. Good grooming.  Neck: Neck symmetrical, not swollen. Normal tracheal position.  Respiratory: No labored breathing, no use of accessory muscles.   Cardiovascular: Normal temperature, normal extremity pulses, no swelling, no varicosities.  Skin: No paleness, no jaundice, no cyanosis. No lesion, no ulcer, no rash.  Neurologic / Psychiatric: Oriented to time, oriented to place, oriented to person. No depression, no anxiety, no agitation.  Gastrointestinal: No mass, no tenderness, no rigidity, non obese abdomen. No CVA or flank tenderness  Musculoskeletal: Normal gait and station of head and neck.     Complexity of Data:  Source Of History:  Patient, Medical Record Summary  Records Review:   Previous Doctor Records, Previous Hospital Records, Previous Patient Records  Urine Test Review:   Urinalysis  X-Ray Review:  KUB: Reviewed Films. Discussed With Patient.  C.T. Abdomen/Pelvis: Reviewed Films. Reviewed Report.     08/22/21 01/28/12 12/31/11 04/22/10 04/10/09  PSA  Total PSA 1.86 ng/mL 2.22  2.35  0.90  1.25   Free PSA  0.34      % Free PSA  15        PROCEDURES:         KUB - 74018  A single view of the abdomen is obtained. Recently identified ovoid shaped opacity lateral to the left sacral wing which Dr. Claudia Desanctis thought was the ureteral calculi at time of last KUB is no longer visible. When I reviewed KUB at that time there was an irregular shaped opacity lateral but overlying the bony structure of L5/L1 above the SI joint which correlated more to the stone that was previously seen in the left proximal ureter on CT scout imaging. Again this is far from definitive and that bony opacity is not well visualized on today's exam. He continues to have numerous opacities overlying the anatomical expected tract of the mid and distal left ureter which all grossly appears stable compared to last imaging study, these are likely benign bony lesion/pelvic phleboliths but a ureteral calculi cannot be completely excluded as well. An obvious bladder calculi is not seen on today's exam. He has numerous but grossly stable appearing prostate calcifications.  Patient confirmed No Neulasta OnPro Device.            Urinalysis w/Scope Dipstick Dipstick Cont'd Micro  Color: Yellow Bilirubin: Neg mg/dL WBC/hpf: 0 - 5/hpf  Appearance: Clear Ketones: Neg mg/dL RBC/hpf: 20 - 40/hpf  Specific Gravity: 1.025 Blood: 3+ ery/uL Bacteria: Few (10-25/hpf)  pH: 5.5 Protein: Neg mg/dL Cystals: NS (Not Seen)  Glucose: Neg mg/dL Urobilinogen: 0.2 mg/dL Casts: NS (Not Seen)    Nitrites: Neg Trichomonas: Not Present    Leukocyte Esterase: Trace leu/uL Mucous: Present      Epithelial Cells: NS (Not Seen)      Yeast: NS (Not Seen)      Sperm: Not Present    ASSESSMENT:      ICD-10 Details  1 GU:   Ureteral calculus - N20.1     PLAN:            Medications New Meds: Tamsulosin Hcl 0.4 mg capsule 1 capsule PO Daily   #30  11 Refill(s)  Pharmacy Name:  [facility]  Address:  [address], [place] [address]  Phone:  [phone]  Fax:  [phone]            Orders Labs Urine Culture  X-Rays: KUB          Schedule Return Visit/Planned Activity: Other See Visit Notes - Follow up MD, Schedule Surgery          Document Letter(s):  Created for Patient: Clinical Summary         Notes:   Numerous bony opacities along the anatomical expected tracts of the mid and distal left ureter which are more than likely benign bony opacities/pelvic phleboliths. He also has numerous prostate calcifications as well. Most of these if not all of the opacities on today's study are grossly stable compared to last KUB study. Somewhere even seen on a prior KUB in 2018. Noting this, I am not able to positively identify the previously identify the calculus seen on prior CT imaging. Patient remains symptomatic to a mild degree. Also with microscopic hematuria on today's UA. Not being able to positively identify the stone on KUB prevents me from recommending shockwave lithotripsy. He is still candidate for ureteroscopy which was discussed in detail today including risk versus benefits and potential for adverse reactions, expected postoperative course. Ultimately may benefit from repeat stone protocol study before scheduling or procedural intervention. I am going to discuss/review imaging with patient's urologist today. Based on her recommendation, the patient will be contacted and scheduled appropriately either for intervention which may still include shockwave lithotripsy based on review, ureteroscopy or repeat CT stone protocol study. He will continue tamsulosin. Recommended over-the-counter anti-inflammatories for any new or worsening pain/discomfort. Increased hydration and staying active as possible also  strongly encouraged. Appropriate follow-up instructions to clinic for worsening symptomology also given. I will have him contacted in the near future to arrange follow-up.

## 2021-10-15 NOTE — Op Note (Signed)
Preoperative diagnosis: left ureteral calculus  Postoperative diagnosis: left ureteral calculus  Procedure:  Cystoscopy left ureteroscopy, laser lithotripsy, basket stone extraction left 79F x 26cm ureteral stent placement with tether left retrograde pyelography with interpretation  Surgeon: Jacalyn Lefevre, MD  Anesthesia: General  Complications: None  Intraoperative findings:  Normal urethra Bilateral lobe hypertrophy prostatic urethra Bilateral orthotropic ureteral orifices left retrograde pyelography did not demonstrate a filling defect Bladder mucosa normal without masses  Approximately 5 mm calculus seen in the left distal ureter  EBL: Minimal  Specimens: left ureteral calculus  Disposition of specimens: [facility] for stone analysis  Indication: Albert [name] is a 66 y.o.   patient with two mid left ureteral stone and associated left symptoms. After reviewing the management options for treatment, the patient elected to proceed with the above surgical procedure(s). We have discussed the potential benefits and risks of the procedure, side effects of the proposed treatment, the likelihood of the patient achieving the goals of the procedure, and any potential problems that might occur during the procedure or recuperation. Informed consent has been obtained.   Description of procedure:  The patient was taken to the operating room and general anesthesia was induced.  The patient was placed in the dorsal lithotomy position, prepped and draped in the usual sterile fashion, and preoperative antibiotics were administered. A preoperative time-out was performed.   Cystourethroscopy was performed.  The patient's urethra was examined and demonstrated bilobar prostatic hypertrophy. The bladder was then systematically examined in its entirety. There was no evidence for any bladder tumors, stones, or other mucosal pathology.    Attention then turned to the left  ureteral orifice and a ureteral catheter was used to intubate the ureteral orifice.  Omnipaque contrast was injected through the ureteral catheter and a retrograde pyelogram was performed with findings as dictated above.  A 0.38 sensor guidewire was then advanced up the left ureter into the renal pelvis under fluoroscopic guidance. The 4.5 Fr semirigid ureteroscope was then advanced into the ureter next to the guidewire and the calculus was identified in the distal ureter.   The stone was then fragmented with the 242 micron holmium laser fiber. All stones were then removed from the ureter with an 0 tip basket.  Reinspection of the ureter revealed no remaining visible stones or fragments.   The wire was then backloaded through the cystoscope and a ureteral stent was advance over the wire using Seldinger technique.  The stent was positioned appropriately under fluoroscopic and cystoscopic guidance.  The wire was then removed with an adequate stent curl noted in the renal pelvis as well as in the bladder.  The bladder was then emptied and the procedure ended.  The patient appeared to tolerate the procedure well and without complications.  The patient was able to be awakened and transferred to the recovery unit in satisfactory condition.   Disposition: The tether of the stent was left on and secured to the ventral aspect of the patient's penis. Instructions for removing the stent have been provided to the patient.

## 2021-10-15 NOTE — Interval H&P Note (Signed)
History and Physical Interval Note:  10/15/2021 1:34 PM  Albert [name]  has presented today for surgery, with the diagnosis of URETERAL CALCULI.  The various methods of treatment have been discussed with the patient and family. After consideration of risks, benefits and other options for treatment, the patient has consented to  Procedure(s): CYSTOSCOPY WITH RETROGRADE PYELOGRAM, URETEROSCOPY , STONE EXTRACTION AND STENT PLACEMENT (Left) HOLMIUM LASER APPLICATION (Left) as a surgical intervention.  The patient's history has been reviewed, patient examined, no change in status, stable for surgery.  I have reviewed the patient's chart and labs.  Questions were answered to the patient's satisfaction.     Mylynn Dinh D Lether Tesch

## 2021-10-15 NOTE — OR Nursing (Signed)
Stone taken by Dr. Claudia Desanctis

## 2021-10-15 NOTE — Discharge Instructions (Addendum)
DISCHARGE INSTRUCTIONS FOR KIDNEY STONE/URETERAL STENT   MEDICATIONS:  1. Resume all your other meds from home  2. AZO over the counter can help with the burning/stinging when you urinate. 3. Tramadol is for moderate/severe pain, otherwise taking up to 1000 mg every 6 hours of plainTylenol will help treat your pain.   4. Take Cephalexin one hour prior to removal of your stent.  5. Tamsulosin can help with stent discomfort.  ACTIVITY:  1. No strenuous activity x 1week  2. No driving while on narcotic pain medications  3. Drink plenty of water  4. Continue to walk at home - you can still get blood clots when you are at home, so keep active, but don't over do it.  5. May return to work/school tomorrow or when you feel ready   BATHING:  1. You can shower and we recommend daily showers  2. You have a string coming from your urethra: The stent string is attached to your ureteral stent. Do not pull on this.   SIGNS/SYMPTOMS TO CALL:  Please call us if you have a fever greater than 101.5, uncontrolled nausea/vomiting, uncontrolled pain, dizziness, unable to urinate, bloody urine, chest pain, shortness of breath, leg swelling, leg pain, redness around wound, drainage from wound, or any other concerns or questions.   You can reach [place] at [phone].   FOLLOW-UP:  1. You have a string attached to your stent, you may remove it on Friday, July 29. To do this, pull the string until the stent is completely removed. You may feel an odd sensation in your back.

## 2021-10-16 ENCOUNTER — Encounter (HOSPITAL_COMMUNITY): Payer: Self-pay | Admitting: Urology

## 2021-10-21 DIAGNOSIS — N201 Calculus of ureter: Secondary | ICD-10-CM | POA: Diagnosis not present

## 2021-10-21 DIAGNOSIS — N281 Cyst of kidney, acquired: Secondary | ICD-10-CM | POA: Diagnosis not present

## 2021-10-21 DIAGNOSIS — R351 Nocturia: Secondary | ICD-10-CM | POA: Diagnosis not present

## 2021-10-21 DIAGNOSIS — N401 Enlarged prostate with lower urinary tract symptoms: Secondary | ICD-10-CM | POA: Diagnosis not present

## 2021-10-26 ENCOUNTER — Other Ambulatory Visit (HOSPITAL_COMMUNITY): Payer: Self-pay

## 2021-10-28 ENCOUNTER — Other Ambulatory Visit (HOSPITAL_COMMUNITY): Payer: Self-pay

## 2021-10-29 ENCOUNTER — Other Ambulatory Visit (HOSPITAL_COMMUNITY): Payer: Self-pay

## 2021-11-27 ENCOUNTER — Other Ambulatory Visit: Payer: Self-pay | Admitting: Surgery

## 2021-11-27 DIAGNOSIS — R1032 Left lower quadrant pain: Secondary | ICD-10-CM | POA: Diagnosis not present

## 2021-12-06 DIAGNOSIS — N200 Calculus of kidney: Secondary | ICD-10-CM | POA: Diagnosis not present

## 2021-12-06 DIAGNOSIS — N401 Enlarged prostate with lower urinary tract symptoms: Secondary | ICD-10-CM | POA: Diagnosis not present

## 2021-12-06 DIAGNOSIS — N281 Cyst of kidney, acquired: Secondary | ICD-10-CM | POA: Diagnosis not present

## 2021-12-06 DIAGNOSIS — R351 Nocturia: Secondary | ICD-10-CM | POA: Diagnosis not present

## 2021-12-27 DIAGNOSIS — R1032 Left lower quadrant pain: Secondary | ICD-10-CM | POA: Diagnosis not present

## 2022-01-21 ENCOUNTER — Other Ambulatory Visit (HOSPITAL_COMMUNITY): Payer: Self-pay

## 2022-01-27 ENCOUNTER — Other Ambulatory Visit: Payer: Self-pay | Admitting: Surgery

## 2022-01-27 DIAGNOSIS — R1032 Left lower quadrant pain: Secondary | ICD-10-CM | POA: Diagnosis not present

## 2022-02-16 ENCOUNTER — Other Ambulatory Visit (HOSPITAL_COMMUNITY): Payer: Self-pay

## 2022-02-17 ENCOUNTER — Other Ambulatory Visit (HOSPITAL_COMMUNITY): Payer: Self-pay

## 2022-03-04 ENCOUNTER — Other Ambulatory Visit (HOSPITAL_COMMUNITY): Payer: Self-pay

## 2022-03-04 ENCOUNTER — Ambulatory Visit: Admit: 2022-03-04 | Payer: 59 | Admitting: Surgery

## 2022-03-04 ENCOUNTER — Other Ambulatory Visit: Payer: Self-pay | Admitting: Surgery

## 2022-03-04 DIAGNOSIS — G8918 Other acute postprocedural pain: Secondary | ICD-10-CM | POA: Diagnosis not present

## 2022-03-04 DIAGNOSIS — R1032 Left lower quadrant pain: Secondary | ICD-10-CM | POA: Diagnosis not present

## 2022-03-04 DIAGNOSIS — R1030 Lower abdominal pain, unspecified: Secondary | ICD-10-CM | POA: Diagnosis not present

## 2022-03-04 DIAGNOSIS — R590 Localized enlarged lymph nodes: Secondary | ICD-10-CM | POA: Diagnosis not present

## 2022-03-04 DIAGNOSIS — D360 Benign neoplasm of lymph nodes: Secondary | ICD-10-CM | POA: Diagnosis not present

## 2022-03-04 SURGERY — EXPLORATION, INGUINAL REGION
Anesthesia: General | Laterality: Left

## 2022-03-04 MED ORDER — OXYCODONE HCL 5 MG PO TABS
5.0000 mg | ORAL_TABLET | Freq: Four times a day (QID) | ORAL | 0 refills | Status: AC | PRN
  Filled 2022-03-04: qty 25, 7d supply, fill #0

## 2022-03-05 ENCOUNTER — Other Ambulatory Visit (HOSPITAL_COMMUNITY): Payer: Self-pay

## 2022-05-14 ENCOUNTER — Other Ambulatory Visit (HOSPITAL_COMMUNITY): Payer: Self-pay

## 2022-07-15 ENCOUNTER — Other Ambulatory Visit (HOSPITAL_COMMUNITY): Payer: Self-pay

## 2022-08-29 ENCOUNTER — Other Ambulatory Visit: Payer: Self-pay

## 2022-09-09 ENCOUNTER — Other Ambulatory Visit (HOSPITAL_COMMUNITY): Payer: Self-pay

## 2022-09-09 DIAGNOSIS — Z125 Encounter for screening for malignant neoplasm of prostate: Secondary | ICD-10-CM | POA: Diagnosis not present

## 2022-09-09 DIAGNOSIS — K219 Gastro-esophageal reflux disease without esophagitis: Secondary | ICD-10-CM | POA: Diagnosis not present

## 2022-09-09 DIAGNOSIS — Z0000 Encounter for general adult medical examination without abnormal findings: Secondary | ICD-10-CM | POA: Diagnosis not present

## 2022-09-09 DIAGNOSIS — J302 Other seasonal allergic rhinitis: Secondary | ICD-10-CM | POA: Diagnosis not present

## 2022-09-09 DIAGNOSIS — M545 Low back pain, unspecified: Secondary | ICD-10-CM | POA: Diagnosis not present

## 2022-09-09 DIAGNOSIS — M129 Arthropathy, unspecified: Secondary | ICD-10-CM | POA: Diagnosis not present

## 2022-09-09 DIAGNOSIS — G8929 Other chronic pain: Secondary | ICD-10-CM | POA: Diagnosis not present

## 2022-09-09 DIAGNOSIS — Z13220 Encounter for screening for lipoid disorders: Secondary | ICD-10-CM | POA: Diagnosis not present

## 2022-09-09 MED ORDER — CYCLOBENZAPRINE HCL 5 MG PO TABS
5.0000 mg | ORAL_TABLET | Freq: Three times a day (TID) | ORAL | 3 refills | Status: AC | PRN
  Filled 2023-03-15: qty 60, 20d supply, fill #0

## 2022-09-09 MED ORDER — MELOXICAM 15 MG PO TABS
7.5000 mg | ORAL_TABLET | Freq: Every day | ORAL | 3 refills | Status: DC
  Filled 2023-03-15: qty 90, 90d supply, fill #0
  Filled 2023-06-22: qty 90, 90d supply, fill #1
  Filled 2023-08-07: qty 90, 90d supply, fill #2

## 2022-09-09 MED ORDER — SILDENAFIL CITRATE 100 MG PO TABS
50.0000 mg | ORAL_TABLET | ORAL | 3 refills | Status: AC | PRN
  Filled 2022-10-21: qty 10, fill #0

## 2022-09-09 MED ORDER — FLUTICASONE PROPIONATE 50 MCG/ACT NA SUSP: 1.0000 | Freq: Every day | NASAL | 3 refills | Status: DC | PRN

## 2022-09-09 MED ORDER — ESOMEPRAZOLE MAGNESIUM 40 MG PO CPDR
40.0000 mg | DELAYED_RELEASE_CAPSULE | Freq: Every day | ORAL | 3 refills | Status: DC
  Filled 2022-10-21 – 2023-02-23 (×2): qty 90, 90d supply, fill #0
  Filled 2023-05-26: qty 90, 90d supply, fill #1
  Filled 2023-09-08: qty 90, 90d supply, fill #2

## 2022-09-10 ENCOUNTER — Other Ambulatory Visit (HOSPITAL_COMMUNITY): Payer: Self-pay

## 2022-09-11 ENCOUNTER — Other Ambulatory Visit (HOSPITAL_COMMUNITY): Payer: Self-pay

## 2022-09-16 ENCOUNTER — Other Ambulatory Visit (HOSPITAL_COMMUNITY): Payer: Self-pay

## 2022-09-16 ENCOUNTER — Other Ambulatory Visit: Payer: Self-pay

## 2022-09-16 MED ORDER — OLOPATADINE HCL 0.1 % OP SOLN
1.0000 [drp] | Freq: Two times a day (BID) | OPHTHALMIC | 1 refills | Status: AC | PRN
  Filled 2022-09-16: qty 5, 30d supply, fill #0

## 2022-09-24 ENCOUNTER — Other Ambulatory Visit (HOSPITAL_COMMUNITY): Payer: Self-pay

## 2022-10-21 ENCOUNTER — Other Ambulatory Visit (HOSPITAL_COMMUNITY): Payer: Self-pay

## 2022-10-22 ENCOUNTER — Other Ambulatory Visit: Payer: Self-pay

## 2022-10-22 ENCOUNTER — Other Ambulatory Visit (HOSPITAL_COMMUNITY): Payer: Self-pay

## 2022-10-22 MED ORDER — PRAMOXINE-HC 1-2.5 % EX OINT
1.0000 | TOPICAL_OINTMENT | Freq: Two times a day (BID) | CUTANEOUS | 5 refills | Status: AC | PRN
  Filled 2022-10-22: qty 56.8, 30d supply, fill #0
  Filled 2023-06-22: qty 56.8, 30d supply, fill #1
  Filled 2023-09-19: qty 56.8, 30d supply, fill #2

## 2022-10-23 ENCOUNTER — Other Ambulatory Visit (HOSPITAL_COMMUNITY): Payer: Self-pay

## 2022-10-24 ENCOUNTER — Other Ambulatory Visit: Payer: Self-pay

## 2022-12-26 ENCOUNTER — Other Ambulatory Visit: Payer: Self-pay

## 2022-12-26 ENCOUNTER — Other Ambulatory Visit (HOSPITAL_COMMUNITY): Payer: Self-pay

## 2022-12-26 DIAGNOSIS — N401 Enlarged prostate with lower urinary tract symptoms: Secondary | ICD-10-CM | POA: Diagnosis not present

## 2022-12-26 DIAGNOSIS — N281 Cyst of kidney, acquired: Secondary | ICD-10-CM | POA: Diagnosis not present

## 2022-12-26 DIAGNOSIS — N200 Calculus of kidney: Secondary | ICD-10-CM | POA: Diagnosis not present

## 2022-12-26 DIAGNOSIS — R351 Nocturia: Secondary | ICD-10-CM | POA: Diagnosis not present

## 2022-12-26 MED ORDER — TAMSULOSIN HCL 0.4 MG PO CAPS
0.4000 mg | ORAL_CAPSULE | Freq: Every day | ORAL | 3 refills | Status: DC
  Filled 2022-12-26: qty 90, 90d supply, fill #0
  Filled 2023-03-15: qty 90, 90d supply, fill #1
  Filled 2023-06-22: qty 90, 90d supply, fill #2
  Filled 2023-09-19: qty 90, 90d supply, fill #3

## 2023-02-23 ENCOUNTER — Other Ambulatory Visit (HOSPITAL_COMMUNITY): Payer: Self-pay

## 2023-03-16 ENCOUNTER — Other Ambulatory Visit: Payer: Self-pay

## 2023-03-16 ENCOUNTER — Other Ambulatory Visit (HOSPITAL_COMMUNITY): Payer: Self-pay

## 2023-05-27 ENCOUNTER — Other Ambulatory Visit (HOSPITAL_COMMUNITY): Payer: Self-pay

## 2023-06-22 ENCOUNTER — Other Ambulatory Visit (HOSPITAL_COMMUNITY): Payer: Self-pay

## 2023-06-22 ENCOUNTER — Other Ambulatory Visit: Payer: Self-pay

## 2023-08-07 ENCOUNTER — Other Ambulatory Visit (HOSPITAL_COMMUNITY): Payer: Self-pay

## 2023-08-27 ENCOUNTER — Encounter

## 2023-08-27 ENCOUNTER — Other Ambulatory Visit: Payer: Self-pay

## 2023-08-27 ENCOUNTER — Telehealth: Admitting: Family Medicine

## 2023-08-27 ENCOUNTER — Other Ambulatory Visit (HOSPITAL_COMMUNITY): Payer: Self-pay

## 2023-08-27 DIAGNOSIS — J3089 Other allergic rhinitis: Secondary | ICD-10-CM

## 2023-08-27 MED ORDER — FLUTICASONE PROPIONATE 50 MCG/ACT NA SUSP
2.0000 | Freq: Every day | NASAL | 0 refills | Status: AC
  Filled 2023-08-27: qty 16, 30d supply, fill #0
  Filled 2023-08-27: qty 16, 60d supply, fill #0

## 2023-08-27 MED ORDER — CETIRIZINE HCL 10 MG PO TABS
10.0000 mg | ORAL_TABLET | Freq: Every day | ORAL | 0 refills | Status: AC
  Filled 2023-08-27 (×2): qty 30, 30d supply, fill #0

## 2023-08-27 NOTE — Progress Notes (Signed)
 E visit for Allergic Rhinitis We are sorry that you are not feeling well.  Here is how we plan to help!  Based on what you have shared with me it looks like you have Allergic Rhinitis.  Rhinitis is when a reaction occurs that causes nasal congestion, runny nose, sneezing, and itching.  Most types of rhinitis are caused by an inflammation and are associated with symptoms in the eyes ears or throat. There are several types of rhinitis.  The most common are acute rhinitis, which is usually caused by a viral illness, allergic or seasonal rhinitis, and nonallergic or year-round rhinitis.  Nasal allergies occur certain times of the year.  Allergic rhinitis is caused when allergens in the air trigger the release of histamine in the body.  Histamine causes itching, swelling, and fluid to build up in the fragile linings of the nasal passages, sinuses and eyelids.  An itchy nose and clear discharge are common.  I recommend the following over the counter treatments: You should take a daily dose of antihistamine  Zytrec daily as directed on the bottle.   I also would recommend a nasal spray: Flonase  2 sprays into each nostril once daily and Saline 1 spray into each nostril as needed This will help open your sinus so they will drain and your ear fullness will improve.    HOME CARE:  You can use an over-the-counter saline nasal spray as needed Avoid areas where there is heavy dust, mites, or molds Stay indoors on windy days during the pollen season Keep windows closed in home, at least in bedroom; use air conditioner. Use high-efficiency house air filter Keep windows closed in car, turn AC on re-circulate Avoid playing out with dog during pollen season  GET HELP RIGHT AWAY IF:  If your symptoms do not improve within 10 days You become short of breath You develop yellow or green discharge from your nose for over 3 days You have coughing fits  MAKE SURE YOU:  Understand these instructions Will  watch your condition Will get help right away if you are not doing well or get worse  Thank you for choosing an e-visit. Your e-visit answers were reviewed by a board certified advanced clinical practitioner to complete your personal care plan. Depending upon the condition, your plan could have included both over the counter or prescription medications. Please review your pharmacy choice. Be sure that the pharmacy you have chosen is open so that you can pick up your prescription now.  If there is a problem you may message your provider in MyChart to have the prescription routed to another pharmacy. Your safety is important to us . If you have drug allergies check your prescription carefully.  For the next 24 hours, you can use MyChart to ask questions about today's visit, request a non-urgent call back, or ask for a work or school excuse from your e-visit provider. You will get an email in the next two days asking about your experience. I hope that your e-visit has been valuable and will speed your recovery.        I provided 5 minutes of non face-to-face time during this encounter for chart review, medication and order placement, as well as and documentation.

## 2023-09-08 ENCOUNTER — Other Ambulatory Visit (HOSPITAL_COMMUNITY): Payer: Self-pay

## 2023-09-14 DIAGNOSIS — R059 Cough, unspecified: Secondary | ICD-10-CM | POA: Diagnosis not present

## 2023-09-14 DIAGNOSIS — Z125 Encounter for screening for malignant neoplasm of prostate: Secondary | ICD-10-CM | POA: Diagnosis not present

## 2023-09-14 DIAGNOSIS — Z720 Tobacco use: Secondary | ICD-10-CM | POA: Diagnosis not present

## 2023-09-14 DIAGNOSIS — N529 Male erectile dysfunction, unspecified: Secondary | ICD-10-CM | POA: Diagnosis not present

## 2023-09-14 DIAGNOSIS — R739 Hyperglycemia, unspecified: Secondary | ICD-10-CM | POA: Diagnosis not present

## 2023-09-14 DIAGNOSIS — Z0000 Encounter for general adult medical examination without abnormal findings: Secondary | ICD-10-CM | POA: Diagnosis not present

## 2023-09-14 DIAGNOSIS — Z87442 Personal history of urinary calculi: Secondary | ICD-10-CM | POA: Diagnosis not present

## 2023-09-14 DIAGNOSIS — Z23 Encounter for immunization: Secondary | ICD-10-CM | POA: Diagnosis not present

## 2023-09-14 DIAGNOSIS — M25511 Pain in right shoulder: Secondary | ICD-10-CM | POA: Diagnosis not present

## 2023-09-14 DIAGNOSIS — K219 Gastro-esophageal reflux disease without esophagitis: Secondary | ICD-10-CM | POA: Diagnosis not present

## 2023-09-14 DIAGNOSIS — Z13220 Encounter for screening for lipoid disorders: Secondary | ICD-10-CM | POA: Diagnosis not present

## 2023-09-14 DIAGNOSIS — M545 Low back pain, unspecified: Secondary | ICD-10-CM | POA: Diagnosis not present

## 2023-09-15 DIAGNOSIS — S46911A Strain of unspecified muscle, fascia and tendon at shoulder and upper arm level, right arm, initial encounter: Secondary | ICD-10-CM | POA: Diagnosis not present

## 2023-09-21 ENCOUNTER — Other Ambulatory Visit: Payer: Self-pay

## 2023-09-22 ENCOUNTER — Other Ambulatory Visit: Payer: Self-pay

## 2023-11-02 DIAGNOSIS — M25511 Pain in right shoulder: Secondary | ICD-10-CM | POA: Diagnosis not present

## 2023-11-06 DIAGNOSIS — M25511 Pain in right shoulder: Secondary | ICD-10-CM | POA: Diagnosis not present

## 2023-11-06 DIAGNOSIS — S43431A Superior glenoid labrum lesion of right shoulder, initial encounter: Secondary | ICD-10-CM | POA: Diagnosis not present

## 2023-11-26 ENCOUNTER — Other Ambulatory Visit (HOSPITAL_COMMUNITY): Payer: Self-pay

## 2023-11-26 ENCOUNTER — Other Ambulatory Visit: Payer: Self-pay | Admitting: Family Medicine

## 2023-11-26 DIAGNOSIS — J3089 Other allergic rhinitis: Secondary | ICD-10-CM

## 2023-11-26 MED ORDER — TAMSULOSIN HCL 0.4 MG PO CAPS
0.4000 mg | ORAL_CAPSULE | Freq: Every day | ORAL | 3 refills | Status: AC
  Filled 2024-02-22: qty 90, 90d supply, fill #0

## 2023-11-27 ENCOUNTER — Other Ambulatory Visit (HOSPITAL_COMMUNITY): Payer: Self-pay

## 2023-11-27 ENCOUNTER — Other Ambulatory Visit: Payer: Self-pay

## 2023-11-27 MED ORDER — MELOXICAM 15 MG PO TABS
7.5000 mg | ORAL_TABLET | Freq: Every day | ORAL | 3 refills | Status: AC
  Filled 2023-11-27: qty 90, 90d supply, fill #0
  Filled 2024-02-22: qty 90, 90d supply, fill #1

## 2023-11-27 MED ORDER — ESOMEPRAZOLE MAGNESIUM 40 MG PO CPDR
40.0000 mg | DELAYED_RELEASE_CAPSULE | Freq: Every day | ORAL | 3 refills | Status: AC
  Filled 2023-11-27: qty 90, 90d supply, fill #0
  Filled 2024-03-01: qty 90, 90d supply, fill #1

## 2023-12-01 ENCOUNTER — Other Ambulatory Visit (HOSPITAL_COMMUNITY): Payer: Self-pay

## 2023-12-01 ENCOUNTER — Other Ambulatory Visit: Payer: Self-pay

## 2023-12-02 ENCOUNTER — Other Ambulatory Visit (HOSPITAL_COMMUNITY): Payer: Self-pay

## 2023-12-03 ENCOUNTER — Other Ambulatory Visit (HOSPITAL_COMMUNITY): Payer: Self-pay

## 2023-12-04 ENCOUNTER — Other Ambulatory Visit: Payer: Self-pay

## 2023-12-04 ENCOUNTER — Other Ambulatory Visit (HOSPITAL_COMMUNITY): Payer: Self-pay

## 2023-12-04 MED ORDER — FLUTICASONE PROPIONATE 50 MCG/ACT NA SUSP
1.0000 | Freq: Every day | NASAL | 3 refills | Status: AC | PRN
  Filled 2023-12-04: qty 16, 30d supply, fill #0

## 2023-12-04 MED ORDER — POLYMYXIN B-TRIMETHOPRIM 10000-0.1 UNIT/ML-% OP SOLN
1.0000 [drp] | Freq: Four times a day (QID) | OPHTHALMIC | 0 refills | Status: AC
  Filled 2023-12-04: qty 10, 50d supply, fill #0

## 2023-12-04 MED ORDER — CYCLOBENZAPRINE HCL 5 MG PO TABS
5.0000 mg | ORAL_TABLET | Freq: Three times a day (TID) | ORAL | 3 refills | Status: AC | PRN
  Filled 2023-12-04: qty 60, 20d supply, fill #0

## 2023-12-04 MED ORDER — PRAMOSONE 1-2.5 % EX OINT
TOPICAL_OINTMENT | CUTANEOUS | 5 refills | Status: AC
  Filled 2023-12-04: qty 56.8, 30d supply, fill #0
  Filled 2024-02-22: qty 56.8, 30d supply, fill #1

## 2023-12-09 ENCOUNTER — Telehealth: Admitting: Physician Assistant

## 2023-12-09 ENCOUNTER — Other Ambulatory Visit (HOSPITAL_COMMUNITY): Payer: Self-pay

## 2023-12-09 ENCOUNTER — Other Ambulatory Visit: Payer: Self-pay

## 2023-12-09 DIAGNOSIS — J069 Acute upper respiratory infection, unspecified: Secondary | ICD-10-CM | POA: Diagnosis not present

## 2023-12-09 DIAGNOSIS — R051 Acute cough: Secondary | ICD-10-CM | POA: Diagnosis not present

## 2023-12-09 MED ORDER — METHYLPREDNISOLONE 4 MG PO TBPK
ORAL_TABLET | ORAL | 0 refills | Status: AC
  Filled 2023-12-09 – 2023-12-10 (×2): qty 21, 6d supply, fill #0

## 2023-12-09 MED ORDER — BENZONATATE 100 MG PO CAPS
100.0000 mg | ORAL_CAPSULE | Freq: Three times a day (TID) | ORAL | 0 refills | Status: AC | PRN
  Filled 2023-12-09 – 2023-12-10 (×2): qty 30, 5d supply, fill #0

## 2023-12-09 NOTE — Progress Notes (Signed)
E-Visit for Cough   We are sorry that you are not feeling well.  Here is how we plan to help!  Based on your presentation I believe you most likely have A cough due to a virus.  This is called viral bronchitis and is best treated by rest, plenty of fluids and control of the cough.  You may use Ibuprofen or Tylenol as directed to help your symptoms.     In addition you may use A prescription cough medication called Tessalon Perles 100mg . You may take 1-2 capsules every 8 hours as needed for your cough.  Prednisone 4 mg daily for 6 days (see taper instructions below)  From your responses in the eVisit questionnaire you describe inflammation in the upper respiratory tract which is causing a significant cough.  This is commonly called Bronchitis and has four common causes:   Allergies Viral Infections Acid Reflux Bacterial Infection Allergies, viruses and acid reflux are treated by controlling symptoms or eliminating the cause. An example might be a cough caused by taking certain blood pressure medications. You stop the cough by changing the medication. Another example might be a cough caused by acid reflux. Controlling the reflux helps control the cough.  USE OF BRONCHODILATOR ("RESCUE") INHALERS: There is a risk from using your bronchodilator too frequently.  The risk is that over-reliance on a medication which only relaxes the muscles surrounding the breathing tubes can reduce the effectiveness of medications prescribed to reduce swelling and congestion of the tubes themselves.  Although you feel brief relief from the bronchodilator inhaler, your asthma may actually be worsening with the tubes becoming more swollen and filled with mucus.  This can delay other crucial treatments, such as oral steroid medications. If you need to use a bronchodilator inhaler daily, several times per day, you should discuss this with your provider.  There are probably better treatments that could be used to keep your  asthma under control.     HOME CARE Only take medications as instructed by your medical team. Complete the entire course of an antibiotic. Drink plenty of fluids and get plenty of rest. Avoid close contacts especially the very young and the elderly Cover your mouth if you cough or cough into your sleeve. Always remember to wash your hands A steam or ultrasonic humidifier can help congestion.   GET HELP RIGHT AWAY IF: You develop worsening fever. You become short of breath You cough up blood. Your symptoms persist after you have completed your treatment plan MAKE SURE YOU  Understand these instructions. Will watch your condition. Will get help right away if you are not doing well or get worse.    Thank you for choosing an e-visit.  Your e-visit answers were reviewed by a board certified advanced clinical practitioner to complete your personal care plan. Depending upon the condition, your plan could have included both over the counter or prescription medications.  Please review your pharmacy choice. Make sure the pharmacy is open so you can pick up prescription now. If there is a problem, you may contact your provider through [employer] and have the prescription routed to another pharmacy.  Your safety is important to [place]. If you have drug allergies check your prescription carefully.   For the next 24 hours you can use MyChart to ask questions about today's visit, request a non-urgent call back, or ask for a work or school excuse. You will get an email in the next two days asking about your experience. I hope  that your e-visit has been valuable and will speed your recovery.   I have spent 5 minutes in review of e-visit questionnaire, review and updating patient chart, medical decision making and response to patient.   Gilberto Better, PA-C

## 2023-12-10 ENCOUNTER — Other Ambulatory Visit (HOSPITAL_COMMUNITY): Payer: Self-pay

## 2023-12-10 ENCOUNTER — Other Ambulatory Visit: Payer: Self-pay

## 2023-12-10 MED ORDER — CETIRIZINE HCL 10 MG PO TABS
10.0000 mg | ORAL_TABLET | Freq: Every day | ORAL | 3 refills | Status: AC
  Filled 2023-12-10: qty 90, 90d supply, fill #0

## 2024-01-11 DIAGNOSIS — R351 Nocturia: Secondary | ICD-10-CM | POA: Diagnosis not present

## 2024-01-11 DIAGNOSIS — R399 Unspecified symptoms and signs involving the genitourinary system: Secondary | ICD-10-CM | POA: Diagnosis not present

## 2024-01-11 DIAGNOSIS — N401 Enlarged prostate with lower urinary tract symptoms: Secondary | ICD-10-CM | POA: Diagnosis not present

## 2024-01-11 DIAGNOSIS — N281 Cyst of kidney, acquired: Secondary | ICD-10-CM | POA: Diagnosis not present

## 2024-01-11 DIAGNOSIS — N200 Calculus of kidney: Secondary | ICD-10-CM | POA: Diagnosis not present

## 2024-02-22 ENCOUNTER — Other Ambulatory Visit: Payer: Self-pay

## 2024-02-22 ENCOUNTER — Other Ambulatory Visit (HOSPITAL_COMMUNITY): Payer: Self-pay

## 2024-03-01 ENCOUNTER — Other Ambulatory Visit (HOSPITAL_COMMUNITY): Payer: Self-pay
